# Patient Record
Sex: Female | Born: 1935 | Hispanic: No | Marital: Married | Smoking: Never smoker
Health system: Southern US, Community
[De-identification: ages and names within clinical notes are randomized; demographics above are authoritative.]

## PROBLEM LIST (undated history)

## (undated) DIAGNOSIS — I739 Peripheral vascular disease, unspecified: Secondary | ICD-10-CM

## (undated) DIAGNOSIS — R197 Diarrhea, unspecified: Secondary | ICD-10-CM

## (undated) DIAGNOSIS — I2510 Atherosclerotic heart disease of native coronary artery without angina pectoris: Secondary | ICD-10-CM

## (undated) DIAGNOSIS — I509 Heart failure, unspecified: Secondary | ICD-10-CM

## (undated) DIAGNOSIS — Z8673 Personal history of transient ischemic attack (TIA), and cerebral infarction without residual deficits: Secondary | ICD-10-CM

## (undated) DIAGNOSIS — I4891 Unspecified atrial fibrillation: Secondary | ICD-10-CM

## (undated) HISTORY — PX: CORONARY ARTERY BYPASS GRAFT: SHX141

## (undated) HISTORY — DX: Diarrhea, unspecified: R19.7

---

## 2015-10-04 HISTORY — PX: FEMORAL-POPLITEAL BYPASS GRAFT: SHX937

## 2015-10-08 ENCOUNTER — Inpatient Hospital Stay (HOSPITAL_COMMUNITY)
Admission: EM | Admit: 2015-10-08 | Discharge: 2015-10-16 | DRG: 871 | Disposition: A | Discharge status: Home Health | Payer: Medicare Other | Source: Other Acute Inpatient Hospital | Attending: Internal Medicine | Admitting: Internal Medicine

## 2015-10-08 DIAGNOSIS — I959 Hypotension, unspecified: Secondary | ICD-10-CM | POA: Diagnosis present

## 2015-10-08 DIAGNOSIS — D72829 Elevated white blood cell count, unspecified: Secondary | ICD-10-CM | POA: Diagnosis present

## 2015-10-08 DIAGNOSIS — D649 Anemia, unspecified: Secondary | ICD-10-CM | POA: Diagnosis not present

## 2015-10-08 DIAGNOSIS — N179 Acute kidney failure, unspecified: Secondary | ICD-10-CM | POA: Diagnosis present

## 2015-10-08 DIAGNOSIS — I4891 Unspecified atrial fibrillation: Secondary | ICD-10-CM | POA: Diagnosis not present

## 2015-10-08 DIAGNOSIS — I509 Heart failure, unspecified: Secondary | ICD-10-CM | POA: Diagnosis not present

## 2015-10-08 DIAGNOSIS — Z8673 Personal history of transient ischemic attack (TIA), and cerebral infarction without residual deficits: Secondary | ICD-10-CM

## 2015-10-08 DIAGNOSIS — I5023 Acute on chronic systolic (congestive) heart failure: Secondary | ICD-10-CM | POA: Diagnosis present

## 2015-10-08 DIAGNOSIS — I480 Paroxysmal atrial fibrillation: Secondary | ICD-10-CM | POA: Diagnosis present

## 2015-10-08 DIAGNOSIS — J189 Pneumonia, unspecified organism: Secondary | ICD-10-CM | POA: Diagnosis present

## 2015-10-08 DIAGNOSIS — R0603 Acute respiratory distress: Secondary | ICD-10-CM

## 2015-10-08 DIAGNOSIS — Z951 Presence of aortocoronary bypass graft: Secondary | ICD-10-CM | POA: Diagnosis not present

## 2015-10-08 DIAGNOSIS — I739 Peripheral vascular disease, unspecified: Secondary | ICD-10-CM | POA: Diagnosis present

## 2015-10-08 DIAGNOSIS — E873 Alkalosis: Secondary | ICD-10-CM | POA: Diagnosis not present

## 2015-10-08 DIAGNOSIS — E876 Hypokalemia: Secondary | ICD-10-CM | POA: Diagnosis present

## 2015-10-08 DIAGNOSIS — I420 Dilated cardiomyopathy: Secondary | ICD-10-CM | POA: Diagnosis not present

## 2015-10-08 DIAGNOSIS — J811 Chronic pulmonary edema: Secondary | ICD-10-CM

## 2015-10-08 DIAGNOSIS — A419 Sepsis, unspecified organism: Secondary | ICD-10-CM | POA: Diagnosis present

## 2015-10-08 DIAGNOSIS — R0600 Dyspnea, unspecified: Secondary | ICD-10-CM | POA: Diagnosis not present

## 2015-10-08 DIAGNOSIS — I2510 Atherosclerotic heart disease of native coronary artery without angina pectoris: Secondary | ICD-10-CM | POA: Diagnosis present

## 2015-10-08 DIAGNOSIS — R509 Fever, unspecified: Secondary | ICD-10-CM | POA: Diagnosis present

## 2015-10-08 DIAGNOSIS — I5022 Chronic systolic (congestive) heart failure: Secondary | ICD-10-CM | POA: Diagnosis not present

## 2015-10-08 DIAGNOSIS — I10 Essential (primary) hypertension: Secondary | ICD-10-CM | POA: Diagnosis not present

## 2015-10-08 DIAGNOSIS — Z95828 Presence of other vascular implants and grafts: Secondary | ICD-10-CM

## 2015-10-08 DIAGNOSIS — R0602 Shortness of breath: Secondary | ICD-10-CM

## 2015-10-08 DIAGNOSIS — J810 Acute pulmonary edema: Secondary | ICD-10-CM | POA: Diagnosis not present

## 2015-10-08 DIAGNOSIS — I429 Cardiomyopathy, unspecified: Secondary | ICD-10-CM | POA: Diagnosis present

## 2015-10-08 DIAGNOSIS — Y95 Nosocomial condition: Secondary | ICD-10-CM | POA: Diagnosis not present

## 2015-10-08 HISTORY — DX: Unspecified atrial fibrillation: I48.91

## 2015-10-08 HISTORY — DX: Heart failure, unspecified: I50.9

## 2015-10-08 HISTORY — DX: Personal history of transient ischemic attack (TIA), and cerebral infarction without residual deficits: Z86.73

## 2015-10-08 HISTORY — DX: Atherosclerotic heart disease of native coronary artery without angina pectoris: I25.10

## 2015-10-08 HISTORY — DX: Peripheral vascular disease, unspecified: I73.9

## 2015-10-08 MED ORDER — SODIUM CHLORIDE 0.9 % IJ SOLN: 3.0000 mL | INTRAMUSCULAR | Status: DC | PRN

## 2015-10-08 MED ORDER — FUROSEMIDE 10 MG/ML IJ SOLN
20.0000 mg | Freq: Four times a day (QID) | INTRAMUSCULAR | Status: DC
  Administered 2015-10-09 (×2): 20 mg via INTRAVENOUS
  Filled 2015-10-08 (×3): qty 2

## 2015-10-08 MED ORDER — SODIUM CHLORIDE 0.9 % IV SOLN: 250.0000 mL | INTRAVENOUS | Status: DC | PRN

## 2015-10-08 MED ORDER — ACETAMINOPHEN 325 MG PO TABS
650.0000 mg | ORAL_TABLET | ORAL | Status: DC | PRN
  Administered 2015-10-09: 650 mg via ORAL
  Filled 2015-10-08: qty 2

## 2015-10-08 MED ORDER — ONDANSETRON HCL 4 MG/2ML IJ SOLN: 4.0000 mg | Freq: Four times a day (QID) | INTRAMUSCULAR | Status: DC | PRN

## 2015-10-08 MED ORDER — IPRATROPIUM-ALBUTEROL 0.5-2.5 (3) MG/3ML IN SOLN
3.0000 mL | Freq: Four times a day (QID) | RESPIRATORY_TRACT | Status: DC
  Administered 2015-10-09 (×2): 3 mL via RESPIRATORY_TRACT
  Filled 2015-10-08 (×3): qty 3

## 2015-10-08 MED ORDER — ENOXAPARIN SODIUM 40 MG/0.4ML ~~LOC~~ SOLN
40.0000 mg | SUBCUTANEOUS | Status: DC
  Administered 2015-10-09 – 2015-10-16 (×8): 40 mg via SUBCUTANEOUS
  Filled 2015-10-08 (×8): qty 0.4

## 2015-10-08 MED ORDER — SODIUM CHLORIDE 0.9 % IJ SOLN
3.0000 mL | Freq: Two times a day (BID) | INTRAMUSCULAR | Status: DC
  Administered 2015-10-09 (×2): 3 mL via INTRAVENOUS

## 2015-10-09 ENCOUNTER — Encounter (HOSPITAL_COMMUNITY): Payer: Self-pay | Admitting: Internal Medicine

## 2015-10-09 ENCOUNTER — Inpatient Hospital Stay (HOSPITAL_COMMUNITY): Payer: Medicare Other

## 2015-10-09 DIAGNOSIS — I509 Heart failure, unspecified: Secondary | ICD-10-CM

## 2015-10-09 DIAGNOSIS — I25810 Atherosclerosis of coronary artery bypass graft(s) without angina pectoris: Secondary | ICD-10-CM

## 2015-10-09 DIAGNOSIS — D72829 Elevated white blood cell count, unspecified: Secondary | ICD-10-CM

## 2015-10-09 DIAGNOSIS — I739 Peripheral vascular disease, unspecified: Secondary | ICD-10-CM | POA: Diagnosis present

## 2015-10-09 DIAGNOSIS — I4891 Unspecified atrial fibrillation: Secondary | ICD-10-CM | POA: Diagnosis present

## 2015-10-09 DIAGNOSIS — R0600 Dyspnea, unspecified: Secondary | ICD-10-CM

## 2015-10-09 DIAGNOSIS — Z95828 Presence of other vascular implants and grafts: Secondary | ICD-10-CM

## 2015-10-09 LAB — CBC WITH DIFFERENTIAL/PLATELET
BASOS PCT: 1 %
Basophils Absolute: 0.1 10*3/uL (ref 0.0–0.1)
EOS ABS: 0.2 10*3/uL (ref 0.0–0.7)
EOS PCT: 2 %
HCT: 23.7 % — ABNORMAL LOW (ref 36.0–46.0)
HEMOGLOBIN: 7.8 g/dL — AB (ref 12.0–15.0)
LYMPHS ABS: 2.2 10*3/uL (ref 0.7–4.0)
Lymphocytes Relative: 25 %
MCH: 31.5 pg (ref 26.0–34.0)
MCHC: 32.9 g/dL (ref 30.0–36.0)
MCV: 95.6 fL (ref 78.0–100.0)
MONOS PCT: 13 %
Monocytes Absolute: 1.2 10*3/uL — ABNORMAL HIGH (ref 0.1–1.0)
NEUTROS PCT: 59 %
Neutro Abs: 5 10*3/uL (ref 1.7–7.7)
PLATELETS: 150 10*3/uL (ref 150–400)
RBC: 2.48 MIL/uL — ABNORMAL LOW (ref 3.87–5.11)
RDW: 14.4 % (ref 11.5–15.5)
WBC: 8.6 10*3/uL (ref 4.0–10.5)

## 2015-10-09 LAB — BASIC METABOLIC PANEL
Anion gap: 11 (ref 5–15)
BUN: 13 mg/dL (ref 6–20)
CALCIUM: 8.4 mg/dL — AB (ref 8.9–10.3)
CO2: 23 mmol/L (ref 22–32)
CREATININE: 1.26 mg/dL — AB (ref 0.44–1.00)
Chloride: 104 mmol/L (ref 101–111)
GFR, EST AFRICAN AMERICAN: 45 mL/min — AB (ref >60)
GFR, EST NON AFRICAN AMERICAN: 39 mL/min — AB (ref >60)
Glucose, Bld: 121 mg/dL — ABNORMAL HIGH (ref 65–99)
Potassium: 3.2 mmol/L — ABNORMAL LOW (ref 3.5–5.1)
SODIUM: 138 mmol/L (ref 135–145)

## 2015-10-09 LAB — CBC
HCT: 27.7 % — ABNORMAL LOW (ref 36.0–46.0)
HEMOGLOBIN: 9.3 g/dL — AB (ref 12.0–15.0)
MCH: 31.8 pg (ref 26.0–34.0)
MCHC: 33.6 g/dL (ref 30.0–36.0)
MCV: 94.9 fL (ref 78.0–100.0)
PLATELETS: 195 10*3/uL (ref 150–400)
RBC: 2.92 MIL/uL — ABNORMAL LOW (ref 3.87–5.11)
RDW: 14.4 % (ref 11.5–15.5)
WBC: 11.7 10*3/uL — ABNORMAL HIGH (ref 4.0–10.5)

## 2015-10-09 LAB — MRSA PCR SCREENING: MRSA BY PCR: NEGATIVE

## 2015-10-09 LAB — ABO/RH: ABO/RH(D): O POS

## 2015-10-09 LAB — MAGNESIUM: MAGNESIUM: 1.4 mg/dL — AB (ref 1.7–2.4)

## 2015-10-09 LAB — PREPARE RBC (CROSSMATCH)

## 2015-10-09 LAB — TSH: TSH: 1.744 u[IU]/mL (ref 0.350–4.500)

## 2015-10-09 LAB — D-DIMER, QUANTITATIVE (NOT AT ARMC): D DIMER QUANT: 1.58 ug{FEU}/mL — AB (ref 0.00–0.50)

## 2015-10-09 MED ORDER — BUDESONIDE 0.5 MG/2ML IN SUSP
0.5000 mg | Freq: Two times a day (BID) | RESPIRATORY_TRACT | Status: DC
  Administered 2015-10-09 – 2015-10-16 (×15): 0.5 mg via RESPIRATORY_TRACT
  Filled 2015-10-09 (×15): qty 2

## 2015-10-09 MED ORDER — METOPROLOL TARTRATE 1 MG/ML IV SOLN
10.0000 mg | Freq: Once | INTRAVENOUS | Status: AC
  Administered 2015-10-09: 10 mg via INTRAVENOUS

## 2015-10-09 MED ORDER — SODIUM CHLORIDE 0.9 % IV SOLN
INTRAVENOUS | Status: DC
  Administered 2015-10-09: 12:00:00 via INTRAVENOUS

## 2015-10-09 MED ORDER — CILOSTAZOL 100 MG PO TABS
100.0000 mg | ORAL_TABLET | Freq: Two times a day (BID) | ORAL | Status: DC
  Administered 2015-10-09 – 2015-10-11 (×5): 100 mg via ORAL
  Filled 2015-10-09 (×7): qty 1

## 2015-10-09 MED ORDER — CLOPIDOGREL BISULFATE 75 MG PO TABS
75.0000 mg | ORAL_TABLET | Freq: Every day | ORAL | Status: DC
  Administered 2015-10-09 – 2015-10-16 (×8): 75 mg via ORAL
  Filled 2015-10-09 (×9): qty 1

## 2015-10-09 MED ORDER — METOPROLOL TARTRATE 1 MG/ML IV SOLN
INTRAVENOUS | Status: AC
  Administered 2015-10-09: 10 mg via INTRAVENOUS
  Filled 2015-10-09: qty 10

## 2015-10-09 MED ORDER — FOLIC ACID 1 MG PO TABS
1.0000 mg | ORAL_TABLET | Freq: Two times a day (BID) | ORAL | Status: DC
  Administered 2015-10-09 – 2015-10-16 (×15): 1 mg via ORAL
  Filled 2015-10-09 (×15): qty 1

## 2015-10-09 MED ORDER — FUROSEMIDE 10 MG/ML IJ SOLN
60.0000 mg | Freq: Once | INTRAMUSCULAR | Status: AC
  Administered 2015-10-09: 60 mg via INTRAVENOUS

## 2015-10-09 MED ORDER — SODIUM CHLORIDE 0.9 % IV BOLUS (SEPSIS)
250.0000 mL | Freq: Once | INTRAVENOUS | Status: AC
Start: 2015-10-09 — End: 2015-10-09
  Administered 2015-10-09: 250 mL via INTRAVENOUS

## 2015-10-09 MED ORDER — MAGNESIUM SULFATE 2 GM/50ML IV SOLN
2.0000 g | Freq: Once | INTRAVENOUS | Status: AC
  Administered 2015-10-09: 2 g via INTRAVENOUS
  Filled 2015-10-09: qty 50

## 2015-10-09 MED ORDER — FUROSEMIDE 10 MG/ML IJ SOLN
INTRAMUSCULAR | Status: AC
  Administered 2015-10-09: 60 mg via INTRAVENOUS
  Filled 2015-10-09: qty 8

## 2015-10-09 MED ORDER — SODIUM CHLORIDE 0.9 % IV SOLN
Freq: Once | INTRAVENOUS | Status: AC
  Administered 2015-10-09: 13:00:00 via INTRAVENOUS

## 2015-10-09 MED ORDER — POTASSIUM CHLORIDE CRYS ER 20 MEQ PO TBCR
40.0000 meq | EXTENDED_RELEASE_TABLET | Freq: Once | ORAL | Status: AC
  Administered 2015-10-09: 40 meq via ORAL
  Filled 2015-10-09: qty 2

## 2015-10-09 MED ORDER — LEVALBUTEROL HCL 0.63 MG/3ML IN NEBU
0.6300 mg | INHALATION_SOLUTION | RESPIRATORY_TRACT | Status: DC | PRN
  Administered 2015-10-09 – 2015-10-12 (×4): 0.63 mg via RESPIRATORY_TRACT
  Filled 2015-10-09 (×4): qty 3

## 2015-10-09 MED ORDER — CETYLPYRIDINIUM CHLORIDE 0.05 % MT LIQD
7.0000 mL | Freq: Two times a day (BID) | OROMUCOSAL | Status: DC
  Administered 2015-10-09 – 2015-10-16 (×13): 7 mL via OROMUCOSAL

## 2015-10-09 MED ORDER — FUROSEMIDE 10 MG/ML IJ SOLN
20.0000 mg | Freq: Once | INTRAMUSCULAR | Status: AC
  Administered 2015-10-09: 20 mg via INTRAVENOUS
  Filled 2015-10-09: qty 2

## 2015-10-09 MED ORDER — DILTIAZEM HCL 100 MG IV SOLR
5.0000 mg/h | INTRAVENOUS | Status: DC
  Administered 2015-10-09: 15 mg/h via INTRAVENOUS
  Administered 2015-10-09: 10 mg/h via INTRAVENOUS
  Administered 2015-10-10 – 2015-10-11 (×5): 15 mg/h via INTRAVENOUS
  Filled 2015-10-09 (×7): qty 100

## 2015-10-09 MED ORDER — METOPROLOL TARTRATE 1 MG/ML IV SOLN
10.0000 mg | Freq: Four times a day (QID) | INTRAVENOUS | Status: DC
  Administered 2015-10-09 – 2015-10-11 (×7): 10 mg via INTRAVENOUS
  Filled 2015-10-09 (×7): qty 10

## 2015-10-09 NOTE — Progress Notes (Signed)
  Echocardiogram 2D Echocardiogram has been performed.  Delcie RochENNINGTON, Teletha Petrea 10/09/2015, 12:38 PM

## 2015-10-09 NOTE — H&P (Addendum)
[facility] History and Physical  Kelly Garner ZOX:[id number] DOB: 06-Nov-1932 DOA: 10/08/2015  Referring physician: Transferred from [place] PCP: No primary care provider on file.   Chief Complaint: Fever and shortness of breath    HPI:  Patient is a 81 year old female with a past medical history of peripheral vascular disease, CHF, CAD with previous history of three-vessel CABG with report of CATH in August of 2016, CVA 5 years ago; who initially presented to [facility] emergency department with complaints of fever, chills and shortness of breath. Patient was status post a Fem-pop bypass performed 5 days ago on 10/04/2015 at [facility] by Dr. Keith Rake. Patient apparently was discharged home on 10/07/2015. . Initial findings included a chest x-ray at [facility] emergency department showing the possibility of CHF/COPD, a BNP of 1187, temperature of 101.45F, and patient found to be in A. fib with RVR heart rates in the 150s. She was started on a diltiazem drip along with Vanco and Zosyn empirically at the emergency department. She was transferred to [facility] for need of higher level of care.  Review of Systems  Constitutional: Positive for fever and chills.  HENT: Negative for ear pain and tinnitus.   Eyes: Negative for photophobia and pain.  Respiratory: Positive for shortness of breath and wheezing.   Cardiovascular: Positive for palpitations and leg swelling. Negative for chest pain.  Gastrointestinal: Negative for vomiting and abdominal pain.  Genitourinary: Negative for urgency and frequency.  Musculoskeletal: Positive for joint pain. Negative for neck pain.  Neurological: Negative for seizures and loss of consciousness.  Endo/Heme/Allergies: Negative for polydipsia. Bruises/bleeds easily.  Psychiatric/Behavioral: Positive for memory loss. Negative for substance abuse.       Past Medical History  Diagnosis Date  . Coronary artery disease   . Peripheral vascular  disease (HCC)   . Congestive heart failure (CHF) (HCC)   . History of CVA (cerebrovascular accident)   . Atrial fibrillation [facility])      Past Surgical History  Procedure Laterality Date  . Femoral-popliteal bypass graft  10/04/15      Social History:  has no tobacco, alcohol, and drug history on file. Where does patient live--home and with whom if at home? Husband Can patient participate in ADLs? Patient needs assistance  Allergies  Allergen Reactions  . Aspirin Swelling and Rash  . Macrobid [employer] Macro] Other (See Comments)    Affects lungs  . Codeine Rash    No family history on file.      Prior to Admission medications   Not on File     Physical Exam: Filed Vitals:   10/08/15 2325 10/09/15 0142 10/09/15 0210  BP: 96/57    Pulse: 98  98  Temp: 99 F (37.2 C)    TempSrc: Oral    Resp: 26    Height:  (1.6 m)    Weight: 92.8 kg (204 lb 9.4 oz)    SpO2: 92% 92% 95%     Constitutional: Vital signs reviewed. Patient is a well-developed and well-nourished in no acute distress and cooperative with exam. Alert and oriented x3.  Head: Normocephalic and atraumatic  Ear: TM normal bilaterally  Mouth: no erythema or exudates, MMM  Eyes: PERRL, EOMI, conjunctivae normal, No scleral icterus.  Neck: Supple, Trachea midline normal ROM, No JVD, mass, thyromegaly, or carotid bruit present.  Cardiovascular: Irregularly irregular rhythm.  Pulmonary/Chest: Positive crackles in the lower lung bases Abdominal: Soft. Non-tender, non-distended, bowel sounds are normal, no masses, organomegaly, or guarding  present.  GU: no CVA tenderness Musculoskeletal: No joint deformities, erythema, or stiffness, ROM full and no nontender Ext: +2 pitting edema of the left lower extremity more so noticeable than the right lower extremity. and no cyanosis, pulses palpable bilaterally (DP and PT)  Hematology: no cervical, inginal, or axillary adenopathy.  Neurological: A&O  x3, strength is decreased on the left side and approximately a 4 out of 5. Family notes that this is chronic and not new.  Skin: Incisional wounds of the left thigh and chronic with staples appeared to be intact with no significant warmth or drainage present  Psychiatric: Normal mood and affect. speech and behavior is normal. Judgment and thought content normal. decreased memory Patient has a right femoral central line in place   Data Review   Micro Results No results found for this or any previous visit (from the past 240 hour(s)).  Radiology Reports No results found.   CBC No results for input(s): WBC, HGB, HCT, PLT, MCV, MCH, MCHC, RDW, LYMPHSABS, MONOABS, EOSABS, BASOSABS, BANDABS in the last 168 hours.  Invalid input(s): NEUTRABS, BANDSABD  Chemistries  No results for input(s): NA, K, CL, CO2, GLUCOSE, BUN, CREATININE, CALCIUM, MG, AST, ALT, ALKPHOS, BILITOT in the last 168 hours.  Invalid input(s): GFRCGP ------------------------------------------------------------------------------------------------------------------ CrCl cannot be calculated (Patient has no serum creatinine result on file.). ------------------------------------------------------------------------------------------------------------------ No results for input(s): HGBA1C in the last 72 hours. ------------------------------------------------------------------------------------------------------------------ No results for input(s): CHOL, HDL, LDLCALC, TRIG, CHOLHDL, LDLDIRECT in the last 72 hours. ------------------------------------------------------------------------------------------------------------------ No results for input(s): TSH, T4TOTAL, T3FREE, THYROIDAB in the last 72 hours.  Invalid input(s): FREET3 ------------------------------------------------------------------------------------------------------------------ No results for input(s): VITAMINB12, FOLATE, FERRITIN, TIBC, IRON, RETICCTPCT in the  last 72 hours.  Coagulation profile No results for input(s): INR, PROTIME in the last 168 hours.  No results for input(s): DDIMER in the last 72 hours.  Cardiac Enzymes No results for input(s): CKMB, TROPONINI, MYOGLOBIN in the last 168 hours.  Invalid input(s): CK ------------------------------------------------------------------------------------------------------------------ Invalid input(s): POCBNP   CBG: No results for input(s): GLUCAP in the last 168 hours.     EKG: Independently reviewed. Shows atrial fibrillation with RVR   Assessment/Plan Principal Problem:  Acute exacerbation of Heart failure [facility](HCC): Patient reported shortness of breath following recent surgery. Last echocardiogram is not known. Patient with a initial BNP of 1187. Chest x-ray showing signs of pulmonary vascular congestion. -Strict ins and outs -Gentle Lasix IV 20 mg q 6hr as tolerated by blood pressure -[facility] [place]    SIRS criteria considering fever 101.11F, atrial fibrillation, and WBC count of 12.5 initially seen at outside facility no clear source known at this time. Would suspect recent surgery site. -Continue empiric antibiotics  Atrial fibrillation with RVR (HCC) -Check TSH -Started patient on a diltiazem drip -Will need to restart home meds when able  Status post femoral-popliteal bypass surgery: Continue to monitor site for any signs of infection appears to be healing well.  Leukocytosis: White blood cell count initially noted to be 12.5 patient with acute febrile 101.11F. -Follow up [facility] [place]    Peripheral vascular disease (HCC) -Restart home meds  Coronary artery disease status post three-vessel CABG. There is note of a recent cath in August 2016. Records are not available at this time.  *Records from outside facility at nursing station.  Code Status:   full Family Communication:  bedside Disposition Plan: admit   Total time spent 55 minutes.Greater than 50% of this time  was spent in counseling, explanation of diagnosis, planning of further management, and coordination of care  Clydie Braun [facility] Pager [phone]  If 7PM-7AM, please contact night-coverage [email or web address] Password [facility] 10/09/2015, 2:37 AM

## 2015-10-09 NOTE — Progress Notes (Signed)
[facility] around midnight, admitted to Rm/3s16 with family at bedside. Pt comes from home with spouse. She is alert and oriented x4. Surgical incision with staples noted to Left groin (Level 0) and Left leg from surgery performed last week (12/2). Incisions clean, dry and intact; open to air. Foley catheter in place with urine return. Placed on telemetry, currently in Afib on Cardizen drip. Oriented to room, instructed to call for assistance before getting out of bed. Dr Katrinka BlazingSmith to bedside to evaluate. Resting comfortably at this time, will continue to monitor

## 2015-10-09 NOTE — Progress Notes (Signed)
[place] TEAM 1 - Stepdown/ICU TEAM PROGRESS NOTE  Wendie SimmerBetty Arceneaux OZH:[id number] DOB: 1933-08-10 DOA: 10/08/2015 PCP: No primary care provider on file.  Admit HPI / Brief Narrative: 81 year old female with a history of PVD, CHF, CAD with three-vessel CABG with report of CATH in August of 2016, CVA 5 years ago who [facility] with complaints of fever, chills and shortness of breath. Patient was status post a Fem[facility] [place] [facility] Dr. Keith RakeJames Drougas. Patient apparently was discharged home on 10/07/2015.  Initial findings included a chest x-ray showing the possibility of CHF/COPD, a BNP of 1187, temperature of 101.65F, and the patient was found to be in A. fib with heart rates in the 150s. She was started on a diltiazem drip along with Vanco and Zosyn empirically. [facility]  HPI/Subjective: Pt seen for f/u visit.  Assessment/Plan:  SIRS temp 101.65F, HR 130, WBC 12.5 initially at outside facility - no clear infection source known at this time - no indication for abx at this time   Hypotension   Hx of Parox Atrial fibrillation with acute RVR  TSH normal - previously on coumadin (stopped due to ICH)  Status post femoral-popliteal bypass surgery monitor site for any signs of infection   Reported hx of CHF Quantification not available   Leukocytosis Resolved   Hypokalemia   Acute renal failure  crt was normal at 0.87 12/4 per Care Everywhere   Normocytic anemia  Goal Hgb should be 8.0 - Hgb was 8.8 on 12/4 per Care Everywhere, but preop was as high as 10.5    Peripheral vascular disease  -Restart home meds  Coronary artery disease status post three-vessel CABG there is note of a recent cath in August 2016 - records are not available at this time  Hx of hemorrhagic CVA in 2009  Code Status: FULL Family Communication: no family present at time of exam Disposition Plan: clinically the pt appears dry to  me - I will transfuse and gently hydrate - cont cardizem gtt for now - check TTE   Consultants: none  Procedures: none  Antibiotics: none  DVT prophylaxis: lovenox   Objective: Blood pressure 96/60, pulse 85, temperature 97.2 F (36.2 C), temperature source Oral, resp. rate 19, height 5\' 3"  (1.6 m), weight 92.8 kg (204 lb 9.4 oz), SpO2 96 %.  Intake/Output Summary (Last 24 hours) at 10/09/15 0937 Last data filed at 10/09/15 0727  Gross per 24 hour  Intake      0 ml  Output    350 ml  Net   -350 ml   Exam: Pt seen for f/u visit.  Data Reviewed: Basic Metabolic Panel:  Recent Labs Lab 10/09/15 0545  NA 138  K 3.2*  CL 104  CO2 23  GLUCOSE 121*  BUN 13  CREATININE 1.26*  CALCIUM 8.4*    CBC:  Recent Labs Lab 10/09/15 0545  WBC 8.6  NEUTROABS 5.0  HGB 7.8*  HCT 23.7*  MCV 95.6  PLT 150    Liver Function Tests: No results for input(s): AST, ALT, ALKPHOS, BILITOT, PROT, ALBUMIN in the last 168 hours. No results for input(s): LIPASE, AMYLASE in the last 168 hours. No results for input(s): AMMONIA in the last 168 hours.  Coags: No results for input(s): INR in the last 168 hours.  Invalid input(s): PT No results for input(s): APTT in the last 168 hours.  Cardiac Enzymes: No results for input(s): CKTOTAL, CKMB, CKMBINDEX,  TROPONINI in the last 168 hours.  CBG: No results for input(s): GLUCAP in the last 168 hours.  Recent Results (from the past 240 hour(s))  MRSA PCR Screening     Status: None   Collection Time: 10/09/15  4:56 AM  Result Value Ref Range Status   MRSA by PCR NEGATIVE NEGATIVE Final    Comment:        The GeneXpert MRSA Assay (FDA approved for NASAL specimens only), is one component of a comprehensive MRSA colonization surveillance program. It is not intended to diagnose MRSA infection nor to guide or monitor treatment for MRSA infections.      Studies:   Recent x-ray studies have been reviewed in detail by the  Attending Physician  Scheduled Meds:  Scheduled Meds: . antiseptic oral rinse  7 mL Mouth Rinse BID  . enoxaparin (LOVENOX) injection  40 mg Subcutaneous Q24H  . furosemide  20 mg Intravenous Q6H  . ipratropium-albuterol  3 mL Nebulization Q6H  . sodium chloride  3 mL Intravenous Q12H    Time spent on care of this patient: No charge   Lonia Blood , MD   [facility] Office  [phone] Pager - Text Page per Loretha Stapler as per below:  On-Call/Text Page:      [email or web address]      password [email or web address]  If 7PM-7AM, please contact night-coverage [email or web address] Password [email or web address] 10/09/2015, 9:37 AM   LOS: 1 day

## 2015-10-09 NOTE — Progress Notes (Signed)
UR COMPLETED  

## 2015-10-10 ENCOUNTER — Inpatient Hospital Stay (HOSPITAL_COMMUNITY): Payer: Medicare Other

## 2015-10-10 ENCOUNTER — Encounter (HOSPITAL_COMMUNITY): Payer: Self-pay | Admitting: *Deleted

## 2015-10-10 DIAGNOSIS — I429 Cardiomyopathy, unspecified: Secondary | ICD-10-CM

## 2015-10-10 DIAGNOSIS — I480 Paroxysmal atrial fibrillation: Secondary | ICD-10-CM | POA: Diagnosis present

## 2015-10-10 DIAGNOSIS — I2510 Atherosclerotic heart disease of native coronary artery without angina pectoris: Secondary | ICD-10-CM | POA: Diagnosis present

## 2015-10-10 DIAGNOSIS — E876 Hypokalemia: Secondary | ICD-10-CM | POA: Diagnosis present

## 2015-10-10 DIAGNOSIS — E873 Alkalosis: Secondary | ICD-10-CM | POA: Diagnosis present

## 2015-10-10 DIAGNOSIS — N179 Acute kidney failure, unspecified: Secondary | ICD-10-CM | POA: Diagnosis present

## 2015-10-10 DIAGNOSIS — J811 Chronic pulmonary edema: Secondary | ICD-10-CM | POA: Diagnosis present

## 2015-10-10 LAB — TYPE AND SCREEN
ABO/RH(D): O POS
ANTIBODY SCREEN: NEGATIVE
UNIT DIVISION: 0
UNIT DIVISION: 0

## 2015-10-10 LAB — COMPREHENSIVE METABOLIC PANEL
ALK PHOS: 63 U/L (ref 38–126)
ALT: 19 U/L (ref 14–54)
ANION GAP: 13 (ref 5–15)
AST: 27 U/L (ref 15–41)
Albumin: 2.7 g/dL — ABNORMAL LOW (ref 3.5–5.0)
BILIRUBIN TOTAL: 1.7 mg/dL — AB (ref 0.3–1.2)
BUN: 15 mg/dL (ref 6–20)
CALCIUM: 9.1 mg/dL (ref 8.9–10.3)
CO2: 23 mmol/L (ref 22–32)
CREATININE: 0.96 mg/dL (ref 0.44–1.00)
Chloride: 103 mmol/L (ref 101–111)
GFR calc Af Amer: >60 mL/min (ref >60)
GFR calc non Af Amer: 54 mL/min — ABNORMAL LOW (ref >60)
GLUCOSE: 166 mg/dL — AB (ref 65–99)
Potassium: 3.8 mmol/L (ref 3.5–5.1)
SODIUM: 139 mmol/L (ref 135–145)
Total Protein: 7.4 g/dL (ref 6.5–8.1)

## 2015-10-10 LAB — CBC
HCT: 32.2 % — ABNORMAL LOW (ref 36.0–46.0)
Hemoglobin: 10.8 g/dL — ABNORMAL LOW (ref 12.0–15.0)
MCH: 31.7 pg (ref 26.0–34.0)
MCHC: 33.5 g/dL (ref 30.0–36.0)
MCV: 94.4 fL (ref 78.0–100.0)
PLATELETS: 194 10*3/uL (ref 150–400)
RBC: 3.41 MIL/uL — AB (ref 3.87–5.11)
RDW: 14.5 % (ref 11.5–15.5)
WBC: 14.4 10*3/uL — AB (ref 4.0–10.5)

## 2015-10-10 LAB — BLOOD GAS, ARTERIAL
Acid-Base Excess: 1.3 mmol/L (ref 0.0–2.0)
BICARBONATE: 24.7 meq/L — AB (ref 20.0–24.0)
O2 Content: 3 L/min
O2 Saturation: 89.1 %
PCO2 ART: 34.4 mmHg — AB (ref 35.0–45.0)
PH ART: 7.469 — AB (ref 7.350–7.450)
Patient temperature: 98.6
TCO2: 25.7 mmol/L (ref 0–100)
pO2, Arterial: 56.2 mmHg — ABNORMAL LOW (ref 80.0–100.0)

## 2015-10-10 LAB — PROTIME-INR
INR: 1.4 (ref 0.00–1.49)
Prothrombin Time: 17.3 s — ABNORMAL HIGH (ref 11.6–15.2)

## 2015-10-10 LAB — APTT: aPTT: 33 s (ref 24–37)

## 2015-10-10 LAB — INFLUENZA PANEL BY PCR (TYPE A & B)
H1N1FLUPCR: NOT DETECTED
INFLBPCR: NEGATIVE
Influenza A By PCR: NEGATIVE

## 2015-10-10 LAB — PROCALCITONIN: PROCALCITONIN: 0.12 ng/mL

## 2015-10-10 LAB — LACTIC ACID, PLASMA: LACTIC ACID, VENOUS: 1.3 mmol/L (ref 0.5–2.0)

## 2015-10-10 LAB — MAGNESIUM: MAGNESIUM: 1.7 mg/dL (ref 1.7–2.4)

## 2015-10-10 MED ORDER — METOPROLOL TARTRATE 1 MG/ML IV SOLN
INTRAVENOUS | Status: AC
  Filled 2015-10-10: qty 5

## 2015-10-10 MED ORDER — LORAZEPAM 2 MG/ML IJ SOLN
1.0000 mg | Freq: Once | INTRAMUSCULAR | Status: AC
  Administered 2015-10-10: 1 mg via INTRAVENOUS
  Filled 2015-10-10: qty 1

## 2015-10-10 MED ORDER — VANCOMYCIN HCL IN DEXTROSE 1-5 GM/200ML-% IV SOLN
1000.0000 mg | Freq: Once | INTRAVENOUS | Status: DC
  Filled 2015-10-10: qty 200

## 2015-10-10 MED ORDER — PIPERACILLIN-TAZOBACTAM 3.375 G IVPB
3.3750 g | Freq: Three times a day (TID) | INTRAVENOUS | Status: AC
  Administered 2015-10-10 – 2015-10-14 (×13): 3.375 g via INTRAVENOUS
  Filled 2015-10-10 (×16): qty 50

## 2015-10-10 MED ORDER — METHYLPREDNISOLONE SODIUM SUCC 125 MG IJ SOLR
60.0000 mg | INTRAMUSCULAR | Status: DC
  Administered 2015-10-10 – 2015-10-11 (×2): 60 mg via INTRAVENOUS
  Filled 2015-10-10 (×2): qty 2

## 2015-10-10 MED ORDER — FUROSEMIDE 10 MG/ML IJ SOLN
80.0000 mg | Freq: Once | INTRAMUSCULAR | Status: AC
  Administered 2015-10-10: 80 mg via INTRAVENOUS
  Filled 2015-10-10: qty 8

## 2015-10-10 MED ORDER — PIPERACILLIN-TAZOBACTAM 3.375 G IVPB 30 MIN
3.3750 g | Freq: Once | INTRAVENOUS | Status: AC
  Administered 2015-10-10: 3.375 g via INTRAVENOUS
  Filled 2015-10-10: qty 50

## 2015-10-10 MED ORDER — VANCOMYCIN HCL 10 G IV SOLR
2000.0000 mg | Freq: Once | INTRAVENOUS | Status: AC
  Administered 2015-10-10: 2000 mg via INTRAVENOUS
  Filled 2015-10-10: qty 2000

## 2015-10-10 MED ORDER — ZOLPIDEM TARTRATE 5 MG PO TABS
5.0000 mg | ORAL_TABLET | Freq: Once | ORAL | Status: AC
  Administered 2015-10-10: 5 mg via ORAL
  Filled 2015-10-10: qty 1

## 2015-10-10 MED ORDER — HALOPERIDOL LACTATE 5 MG/ML IJ SOLN: 3.0000 mg | Freq: Four times a day (QID) | INTRAMUSCULAR | Status: DC | PRN

## 2015-10-10 MED ORDER — METOPROLOL TARTRATE 1 MG/ML IV SOLN
5.0000 mg | Freq: Once | INTRAVENOUS | Status: AC
  Administered 2015-10-10: 5 mg via INTRAVENOUS

## 2015-10-10 MED ORDER — VANCOMYCIN HCL 10 G IV SOLR
1250.0000 mg | INTRAVENOUS | Status: DC
  Administered 2015-10-11: 1250 mg via INTRAVENOUS
  Filled 2015-10-10: qty 1250

## 2015-10-10 NOTE — Progress Notes (Addendum)
ANTIBIOTIC CONSULT NOTE - INITIAL  Pharmacy Consult for vanc/zosyn Indication: sepsis  Allergies  Allergen Reactions  . Aspirin Swelling and Rash  . Macrobid [employer][Nitrofurantoin Monohyd Macro] Other (See Comments)    Affects lungs  . Codeine Rash  . Other Rash    Metal underneath watch caused redness to skin    Patient Measurements: Height: 5\' 3"  (160 cm) Weight: 204 lb 2.3 oz (92.6 kg) IBW/kg (Calculated) : 52.4   Vital Signs: Temp: 98.6 F (37 C) (12/08 0748) Temp Source: Axillary (12/08 0748) BP: 147/88 mmHg (12/08 0400) Pulse Rate: 84 (12/08 0400) Intake/Output from previous day: 12/07 0701 - 12/08 0700 In: 2786.2 [P.O.:1020; I.V.:1140.2; Blood:576; IV Piggyback:50] Out: 2550 [Urine:2550] Intake/Output from this shift:    Labs:  Recent Labs  10/09/15 0545 10/09/15 1645 10/10/15 0600  WBC 8.6 11.7* 14.4*  HGB 7.8* 9.3* 10.8*  PLT 150 195 194  CREATININE 1.26*  --  0.96   Estimated Creatinine Clearance: 50.5 mL/min (by C-G formula based on Cr of 0.96). No results for input(s): VANCOTROUGH, VANCOPEAK, VANCORANDOM, GENTTROUGH, GENTPEAK, GENTRANDOM, TOBRATROUGH, TOBRAPEAK, TOBRARND, AMIKACINPEAK, AMIKACINTROU, AMIKACIN in the last 72 hours.   Microbiology: Recent Results (from the past 720 hour(s))  MRSA PCR Screening     Status: None   Collection Time: 10/09/15  4:56 AM  Result Value Ref Range Status   MRSA by PCR NEGATIVE NEGATIVE Final    Comment:        The GeneXpert MRSA Assay (FDA approved for NASAL specimens only), is one component of a comprehensive MRSA colonization surveillance program. It is not intended to diagnose MRSA infection nor to guide or monitor treatment for MRSA infections.     Medical History: Past Medical History  Diagnosis Date  . Coronary artery disease   . Peripheral vascular disease (HCC)   . Congestive heart failure (CHF) (HCC)   . History of CVA (cerebrovascular accident)   . Atrial fibrillation (HCC)       Assessment: 82 yof presenting with fever, chills, SOB. [facility] Pharmacy consulted to dose vanc/zosyn for sepsis, D#1. Afeb, wbc up to 14.4. SCr down to 0.96, CrCl~50. Communicated order of abx and 1 hour time window to give abx with RN.  12/8 Vanc>> 12/8 Zosyn>>  12/8 BCx2>> 12/8 UC>> 12/8 Resp virus panel>> MRSA pcr neg  Goal of Therapy:  Vancomycin trough level 15-20 mcg/ml  Plan:  Vanc 2g IV x 1 dose; then Vanc 1250mg  IV q24h Zosyn 3.375g IV (30min inf); then Zoysn 3.375g IV q8h Monitor clinical progress, c/s, renal function, abx plan/LOT VT@SS  as indicated  Babs BertinHaley Cartez Mogle, PharmD, [facility] Clinical Pharmacist Pager [phone] 10/10/2015 9:51 AM

## 2015-10-10 NOTE — Progress Notes (Signed)
New orders rec'd to give lasix IV for pulmonary edema confirmed by cxr and labwork ordered. Will continue to monitor.

## 2015-10-10 NOTE — Progress Notes (Signed)
New orders for HR 120's lopressor IV given

## 2015-10-10 NOTE — Progress Notes (Addendum)
Pt pulled foley out, new orders rec'd to reinsert follow to monitor strict in's and out due to pt being unstable. Mittens applied.

## 2015-10-10 NOTE — Progress Notes (Signed)
[place] TEAM 1 - Stepdown/ICU TEAM Progress Note  Kelly Garner AVW:[id number] DOB: 11/26/1932 DOA: 10/08/2015 PCP: No primary care provider on file.  Admit HPI / Brief Narrative: 81 yo WF PMHx PVD, CHF, CAD with three-vessel CABG with report of CATH in August of 2016, CVA 5 years ago   Presented to [facility] ED with complaints of fever, chills and shortness of breath. Patient was status post a Fem-pop bypass on 10/04/2015 at [facility] by Dr. Keith Rake. Patient apparently was discharged home on 10/07/2015. Initial findings included a chest x-ray showing the possibility of CHF/COPD, a BNP of 1187, temperature of 101.21F, and the patient was found to be in A. fib with heart rates in the 150s. She was started on a diltiazem drip along with Vanco and Zosyn empirically. She was transferred to [facility].   HPI/Subjective: 12/8 A/O 2 (does not know when, why), follows most commands, does not know her dry weight   Assessment/Plan: Sepsis unspecified organism -Patient has criteria for SIRS + AMS which meets criteria for sepsis  -Afebrile overnight.  -Leukocytosis trending up  -Patient tachypnea RR 31, WBC= 14.4. Initiate sepsis workup.  Paroxysmal Atrial fibrillation with acute RVR  -TSH normal  - previously on coumadin (stopped due to ICH) -Continue Cardizem drip -Continue metoprolol 10 mg QID -  Dilated cardiomyopathy -Quantification not available -Strict in and out -13.74ml -Daily a.m. Weight; admission weight bed = 92.8 kg      12/8 weight= 92.6 kg -12/8 PCXR worsening pulmonary edema/small pleural effusion Lasix 80 mg 1  Peripheral vascular disease  -DC Cilostazol  as it is contraindicated in CHF patients  Coronary artery disease status post three-vessel CABG -there is note of a recent cath in August 2016 - records are not available at this time  Anion gap Respiratory alkalosis -Most likely secondary to sepsis however in this patient DDx  would also be stroke,  PE -See sepsis -If continue altered mental status after removing sedating medication would obtain head CT -If continued SOB/tachypnea perform CT angiogram  Wheezing/SOB -SPO2 staying and low 90s however patient using accessory muscles to breath with altered mental status -Solu-Medrol 60 mg daily -Xopenex QID (A. fib with RVR) -Flutter valve  Status post femoral-popliteal bypass surgery -monitor site for any signs of infection   Hypokalemia  -Potassium goal> 4  Hypomagnesemia -Magnesium goal> 2  Acute renal failure  Cr was normal at 0.87 12/4 per Care Everywhere   Normocytic anemia  -Goal Hgb should be 8.0 - Hgb was 8.8 on 12/4 per Care Everywhere, but preop was as high as 10.5    Hx of hemorrhagic CVA in 2009  Altered mental status -Could be multifactorial to include overmedication i.e. Ambien + Ativan, sepsis, acute respiratory failure  -Please do not use Ambien or Ativan in this 81 year old septic patient.   Agitation -Haldol 3 mg QID PRN     Code Status: FULL Family Communication: Spoke with daughter on phone received permission for CVL/PICC line placement Disposition Plan: Resolution sepsis    Consultants: NA  Procedure/Significant Events: 12/7 echocardiogram;- Left ventricle: mildly dilated. -LVEF= 50% to 55%.  - Left atrium: moderately dilated. - Inferior vena cava: The vessel was dilated. The respirophasic  diameter changes were blunted (< 50%), consistent with elevated CVP.   Culture 12/8 blood culture pending 12/8 urine culture pending 12/8 respiratory panel 12/8 influenza panel   Antibiotics: Zosyn 12/8>> Vancomycin 12/8>>   DVT prophylaxis: NA   Devices NA   LINES / TUBES:  NA    Continuous Infusions: . sodium chloride 10 mL/hr at 10/09/15 1649  . diltiazem (CARDIZEM) infusion 15 mg/hr (10/10/15 1504)    Objective: VITAL SIGNS: Temp: 99 F (37.2 C) (12/08 1500) Temp Source: Oral (12/08 1500) BP: 105/61 mmHg (12/08  1800) Pulse Rate: 99 (12/08 1800) SPO2; FIO2:   Intake/Output Summary (Last 24 hours) at 10/10/15 1857 Last data filed at 10/10/15 1504  Gross per 24 hour  Intake 1820.09 ml  Output   2585 ml  Net -764.91 ml     Exam: General:A/O 2 (does not know when, why), follows most commands, positive Acute respiratory distress (using accessory muscles)  Eyes: Negative headache, eye pain, negative scleral hemorrhage ENT: Negative Runny nose, negative gingival bleeding, Neck:  Negative scars, masses, torticollis, lymphadenopathy, JVD Lungs: air movement in all lung fields with diffuse expiratory wheezing, use of accessory muscles, negative crackles  Cardiovascular: Irregular irregular rhythm and rate, without murmur gallop or rub normal S1 and S2 Abdomen:negative abdominal pain, nondistended, positive soft, bowel sounds, no rebound, no ascites, no appreciable mass Extremities: No significant cyanosis, clubbing, or edema bilateral lower extremities Psychiatric:  Negative depression, negative anxiety, negative fatigue, negative mania  Neurologic:  Cranial nerves II through XII intact, tongue/uvula midline, all extremities muscle strength 5/5, sensation intact throughout, negative dysarthria, negative expressive aphasia, negative receptive aphasia.   Data Reviewed: Basic Metabolic Panel:  Recent Labs Lab 10/09/15 0545 10/09/15 1645 10/10/15 0600 10/10/15 0930  NA 138  --  139  --   K 3.2*  --  3.8  --   CL 104  --  103  --   CO2 23  --  23  --   GLUCOSE 121*  --  166*  --   BUN 13  --  15  --   CREATININE 1.26*  --  0.96  --   CALCIUM 8.4*  --  9.1  --   MG  --  1.4*  --  1.7   Liver Function Tests:  Recent Labs Lab 10/10/15 0600  AST 27  ALT 19  ALKPHOS 63  BILITOT 1.7*  PROT 7.4  ALBUMIN 2.7*   No results for input(s): LIPASE, AMYLASE in the last 168 hours. No results for input(s): AMMONIA in the last 168 hours. CBC:  Recent Labs Lab 10/09/15 0545 10/09/15 1645  10/10/15 0600  WBC 8.6 11.7* 14.4*  NEUTROABS 5.0  --   --   HGB 7.8* 9.3* 10.8*  HCT 23.7* 27.7* 32.2*  MCV 95.6 94.9 94.4  PLT 150 195 194   Cardiac Enzymes: No results for input(s): CKTOTAL, CKMB, CKMBINDEX, TROPONINI in the last 168 hours. BNP (last 3 results) No results for input(s): BNP in the last 8760 hours.  ProBNP (last 3 results) No results for input(s): PROBNP in the last 8760 hours.  CBG: No results for input(s): GLUCAP in the last 168 hours.  Recent Results (from the past 240 hour(s))  MRSA PCR Screening     Status: None   Collection Time: 10/09/15  4:56 AM  Result Value Ref Range Status   MRSA by PCR NEGATIVE NEGATIVE Final    Comment:        The GeneXpert MRSA Assay (FDA approved for NASAL specimens only), is one component of a comprehensive MRSA colonization surveillance program. It is not intended to diagnose MRSA infection nor to guide or monitor treatment for MRSA infections.      Studies:  Recent x-ray studies have been reviewed in detail by the  Attending Physician  Scheduled Meds:  Scheduled Meds: . antiseptic oral rinse  7 mL Mouth Rinse BID  . budesonide  0.5 mg Nebulization BID  . cilostazol  100 mg Oral BID  . clopidogrel  75 mg Oral Daily  . enoxaparin (LOVENOX) injection  40 mg Subcutaneous Q24H  . folic acid  1 mg Oral BID  . methylPREDNISolone (SOLU-MEDROL) injection  60 mg Intravenous Q24H  . metoprolol  10 mg Intravenous 4 times per day  . piperacillin-tazobactam (ZOSYN)  IV  3.375 g Intravenous 3 times per day  . [START ON 10/11/2015] vancomycin  1,250 mg Intravenous Q24H    Time spent on care of this patient: 40 mins   WOODS, Roselind Messier , MD  [facility] Office  [phone] Pager - [phone]  On-Call/Text Page:      [email or web address]      password [email or web address]  If 7PM-7AM, please contact night-coverage [email or web address] Password [email or web address] 10/10/2015, 6:57 PM   LOS: 2 days   Care during the described time interval was  provided by me .  I have reviewed this patient's available data, including medical history, events of note, physical examination, and all test results as part of my evaluation. I have personally reviewed and interpreted all radiology studies.   Carolyne Littles, MD [phone] Pager

## 2015-10-10 NOTE — Progress Notes (Signed)
Paged dr Joseph Artwoods regarding pt wheezy, HR 120's, resp 30's, using accessory muscles, very confused, trashing in the bed, attempting to get out of bed. Increased oxygen to 4l/min. New orders rec'd for stat portable chest xray and ABG, notify respiratory.

## 2015-10-10 NOTE — Procedures (Signed)
Pt placed on home BiPAP with 3L of oxygen added.  Pt is resting comfortably and tolerating well.

## 2015-10-11 ENCOUNTER — Inpatient Hospital Stay (HOSPITAL_COMMUNITY): Payer: Medicare Other

## 2015-10-11 DIAGNOSIS — J810 Acute pulmonary edema: Secondary | ICD-10-CM

## 2015-10-11 DIAGNOSIS — E876 Hypokalemia: Secondary | ICD-10-CM

## 2015-10-11 DIAGNOSIS — N179 Acute kidney failure, unspecified: Secondary | ICD-10-CM

## 2015-10-11 LAB — BASIC METABOLIC PANEL
ANION GAP: 10 (ref 5–15)
BUN: 30 mg/dL — ABNORMAL HIGH (ref 6–20)
CALCIUM: 9 mg/dL (ref 8.9–10.3)
CO2: 24 mmol/L (ref 22–32)
Chloride: 104 mmol/L (ref 101–111)
Creatinine, Ser: 1.28 mg/dL — ABNORMAL HIGH (ref 0.44–1.00)
GFR, EST AFRICAN AMERICAN: 45 mL/min — AB (ref >60)
GFR, EST NON AFRICAN AMERICAN: 38 mL/min — AB (ref >60)
GLUCOSE: 175 mg/dL — AB (ref 65–99)
Potassium: 3.6 mmol/L (ref 3.5–5.1)
Sodium: 138 mmol/L (ref 135–145)

## 2015-10-11 LAB — URINE CULTURE: Culture: NO GROWTH

## 2015-10-11 LAB — CBC WITH DIFFERENTIAL/PLATELET
BASOS ABS: 0 10*3/uL (ref 0.0–0.1)
BASOS PCT: 0 %
EOS PCT: 0 %
Eosinophils Absolute: 0 10*3/uL (ref 0.0–0.7)
HEMATOCRIT: 26.4 % — AB (ref 36.0–46.0)
Hemoglobin: 9.1 g/dL — ABNORMAL LOW (ref 12.0–15.0)
Lymphocytes Relative: 9 %
Lymphs Abs: 1 10*3/uL (ref 0.7–4.0)
MCH: 32.6 pg (ref 26.0–34.0)
MCHC: 34.5 g/dL (ref 30.0–36.0)
MCV: 94.6 fL (ref 78.0–100.0)
MONO ABS: 1 10*3/uL (ref 0.1–1.0)
Monocytes Relative: 9 %
NEUTROS ABS: 9.2 10*3/uL — AB (ref 1.7–7.7)
Neutrophils Relative %: 82 %
PLATELETS: 186 10*3/uL (ref 150–400)
RBC: 2.79 MIL/uL — ABNORMAL LOW (ref 3.87–5.11)
RDW: 14.2 % (ref 11.5–15.5)
WBC: 11.2 10*3/uL — ABNORMAL HIGH (ref 4.0–10.5)

## 2015-10-11 LAB — MAGNESIUM: Magnesium: 2 mg/dL (ref 1.7–2.4)

## 2015-10-11 MED ORDER — SODIUM CHLORIDE 0.9 % IJ SOLN
10.0000 mL | INTRAMUSCULAR | Status: DC | PRN
  Administered 2015-10-14: 10 mL
  Administered 2015-10-15: 20 mL
  Administered 2015-10-15: 10 mL
  Filled 2015-10-11 (×3): qty 40

## 2015-10-11 MED ORDER — ACETAMINOPHEN 500 MG PO TABS
500.0000 mg | ORAL_TABLET | Freq: Four times a day (QID) | ORAL | Status: DC | PRN
  Administered 2015-10-11 – 2015-10-13 (×2): 1000 mg via ORAL
  Filled 2015-10-11 (×3): qty 2

## 2015-10-11 MED ORDER — METOPROLOL TARTRATE 1 MG/ML IV SOLN
5.0000 mg | Freq: Four times a day (QID) | INTRAVENOUS | Status: AC
  Administered 2015-10-11 – 2015-10-12 (×5): 5 mg via INTRAVENOUS
  Filled 2015-10-11 (×6): qty 5

## 2015-10-11 MED ORDER — METOPROLOL SUCCINATE ER 25 MG PO TB24
25.0000 mg | ORAL_TABLET | Freq: Every day | ORAL | Status: DC
  Administered 2015-10-11 – 2015-10-12 (×2): 25 mg via ORAL
  Filled 2015-10-11 (×2): qty 1

## 2015-10-11 MED ORDER — SODIUM CHLORIDE 0.9 % IJ SOLN
10.0000 mL | Freq: Two times a day (BID) | INTRAMUSCULAR | Status: DC
  Administered 2015-10-11: 10 mL

## 2015-10-11 NOTE — Progress Notes (Signed)
Pt. Restless, states she cannot sleep.  Pt asked if she would like something to help rest.  Pt states "anything to help me get some rest".  NP on call notified and ambien ordered.  Will give and continue to monitor pt.

## 2015-10-11 NOTE — Procedures (Signed)
Pt placed on home BiPAP by RN.  RT checked pt.  Pt is comfortable and resting well.

## 2015-10-11 NOTE — Clinical Documentation Improvement (Signed)
Internal Medicine  Can the diagnosis of CHF be further specified?    Acuity - Acute, Chronic, Acute on Chronic   Type - Systolic, Diastolic, Systolic and Diastolic  Other  Clinically Undetermined  Document any associated diagnoses/conditions  Please update your documentation within the medical record to reflect your response to this query. Thank you.  Supporting Information:(As per notes) "Acute exacerbation of Heart failure [facility](HCC): Patient reported shortness of breath following recent surgery."   Please exercise your independent, professional judgment when responding. A specific answer is not anticipated or expected.   Thank You, Nevin BloodgoodJoan B Zachory Mangual, RN, BSN, CCDS,Clinical Documentation Specialist:  [phone]  [phone]=[place]- Health Information Management

## 2015-10-11 NOTE — Progress Notes (Signed)
Pt continuously calling out for family members, hanging legs off side of bed, taking off oxygen, pulling leads off.  Pt educated on safety, and importance of oxygen.  Pt disoriented to place, but does not reorient like before.  Pt is frequently attempting to get out of bed and is in danger of self harm.  Pt will not keep oxygen on and has episodes of desaturation without O2.  NP notified of situation and restlessness of pt.  Ativan ordered.  Will give and continue to monitor.

## 2015-10-11 NOTE — Progress Notes (Signed)
Pt disoriented to place.  Pt continuously takes off Nasal Cannula or BiPAP, attempts to get out of bed.  Pt educated on importance of rest and O2, especially BiPAP at night.  Bed alarm on and this RN outside of room.  Will continue to monitor.

## 2015-10-11 NOTE — Progress Notes (Signed)
[place] TEAM 1 - Stepdown/ICU TEAM PROGRESS NOTE  Kelly Garner WUJ:[id number] DOB: [birth year] DOA: 10/08/2015 PCP: No primary care provider on file.  Admit HPI / Brief Narrative: 81 year old female with a history of PVD, CHF, CAD with three-vessel CABG with report of cath in August of 2016, CVA 5 years ago who initially presented to [facility] ED with complaints of fever, chills and shortness of breath. Patient was status post a Fem-pop bypass on 10/04/2015 at [facility] by Dr. Keith Rake. Patient apparently was discharged home on 10/07/2015.  Initial findings included a chest x-ray showing the possibility of CHF/COPD, a BNP of 1187, temperature of 101.66F, and the patient was found to be in A. fib with heart rates in the 150s. She was started on a diltiazem drip along with Vanco and Zosyn empirically. She was transferred to [facility].  HPI/Subjective: The patient is much more stable today.  Her heart rate is currently well-controlled.  She is alert oriented and conversant.  She denies chest pain shortness breath fevers chills nausea or vomiting.  Assessment/Plan:  SIRS v/s Sepsis unspecified organism temp 101.66F, HR 130, WBC 12.5 initially at outside facility - no clear infection source known at this time - empiric antibiotics started 12/8 - ? Left basilar infiltrate on most recent chest x-ray  Hypotension  Resolved - follow closely in step down unit  Hx of Parox Atrial fibrillation with acute RVR  TSH normal - previously on coumadin (stopped due to ICH) - attempt to wean Cardizem drip today  Status post femoral-popliteal bypass surgery monitor site for any signs of infection   Mild chronic systolic CHF  TTE this admit notes EF 50-55% w/ normal WMA but unable to comment on diastolic fxn - volume status essentially balance since admission - no clinical evidence of volume overload presently  Wheezing/SOB Resolved at this time  Leukocytosis Resolved then recurred but patient  dosed with steroids - now on empiric antibiotics  Hypokalemia  Corrected  Acute renal failure  crt was normal at 0.87 12/4 per Care Everywhere - crt now climbing - stop vancomycin without clear evidence of MRSA infection  Altered mental status Appears most consistent with sundowning - avoid benzodiazepines or Ambien  Normocytic anemia  Goal Hgb should be 8.0 - Hgb was 8.8 on 12/4 per Care Everywhere, but preop was as high as 10.5    Peripheral vascular disease  -Restart home meds  Coronary artery disease status post three-vessel CABG there is note of a recent cath in August 2016 - records are not available at this time  Hx of hemorrhagic CVA in 2009  Code Status: FULL Family Communication: no family present at time of exam Disposition Plan: SDU  Consultants: none  Procedures: TTE - 12/7 - as noted above  Antibiotics: Zosyn 12/8 > Vancomycin 12/8 > 12/9  DVT prophylaxis: lovenox   Objective: Blood pressure 117/64, pulse 102, temperature 98.2 F (36.8 C), temperature source Oral, resp. rate 18, height  (1.6 m), weight 92 kg (202 lb 13.2 oz), SpO2 94 %.  Intake/Output Summary (Last 24 hours) at 10/11/15 1046 Last data filed at 10/11/15 0900  Gross per 24 hour  Intake 591.67 ml  Output   1185 ml  Net -593.33 ml   Exam: General: No acute respiratory distress - alert and conversant Lungs: Mild bibasilar crackles with good air movement throughout other fields and no wheeze Cardiovascular: Regular rate without murmur gallop or rub  Abdomen: Nontender, nondistended, soft, bowel sounds positive, no rebound,  no ascites, no appreciable mass Extremities: No significant cyanosis, clubbing, or edema bilateral lower extremities   Data Reviewed: Basic Metabolic Panel:  Recent Labs Lab 10/09/15 0545 10/09/15 1645 10/10/15 0600 10/10/15 0930 10/11/15 0512  NA 138  --  139  --  138  K 3.2*  --  3.8  --  3.6  CL 104  --  103  --  104  CO2 23  --  23  --  24    GLUCOSE 121*  --  166*  --  175*  BUN 13  --  15  --  30*  CREATININE 1.26*  --  0.96  --  1.28*  CALCIUM 8.4*  --  9.1  --  9.0  MG  --  1.4*  --  1.7 2.0    CBC:  Recent Labs Lab 10/09/15 0545 10/09/15 1645 10/10/15 0600 10/11/15 0512  WBC 8.6 11.7* 14.4* 11.2*  NEUTROABS 5.0  --   --  9.2*  HGB 7.8* 9.3* 10.8* 9.1*  HCT 23.7* 27.7* 32.2* 26.4*  MCV 95.6 94.9 94.4 94.6  PLT 150 195 194 186    Liver Function Tests:  Recent Labs Lab 10/10/15 0600  AST 27  ALT 19  ALKPHOS 63  BILITOT 1.7*  PROT 7.4  ALBUMIN 2.7*    Coags:  Recent Labs Lab 10/10/15 1045  INR 1.40    Recent Labs Lab 10/10/15 1045  APTT 33    Recent Results (from the past 240 hour(s))  MRSA PCR Screening     Status: None   Collection Time: 10/09/15  4:56 AM  Result Value Ref Range Status   MRSA by PCR NEGATIVE NEGATIVE Final    Comment:        The GeneXpert MRSA Assay (FDA approved for NASAL specimens only), is one component of a comprehensive MRSA colonization surveillance program. It is not intended to diagnose MRSA infection nor to guide or monitor treatment for MRSA infections.   Culture, Urine     Status: None   Collection Time: 10/10/15 10:12 AM  Result Value Ref Range Status   Specimen Description URINE, CATHETERIZED  Final   Special Requests NONE  Final   Culture NO GROWTH 1 DAY  Final   Report Status 10/11/2015 FINAL  Final     Studies:   Recent x-ray studies have been reviewed in detail by the Attending Physician  Scheduled Meds:  Scheduled Meds: . antiseptic oral rinse  7 mL Mouth Rinse BID  . budesonide  0.5 mg Nebulization BID  . cilostazol  100 mg Oral BID  . clopidogrel  75 mg Oral Daily  . enoxaparin (LOVENOX) injection  40 mg Subcutaneous Q24H  . folic acid  1 mg Oral BID  . methylPREDNISolone (SOLU-MEDROL) injection  60 mg Intravenous Q24H  . metoprolol  10 mg Intravenous 4 times per day  . piperacillin-tazobactam (ZOSYN)  IV  3.375 g  Intravenous 3 times per day  . vancomycin  1,250 mg Intravenous Q24H    Time spent on care of this patient: 35mins   Kelly Garner , MD   [facility] Office  [phone] Pager - Text Page per Loretha StaplerAmion as per below:  On-Call/Text Page:      [email or web address]      password [email or web address]  If 7PM-7AM, please contact night-coverage [email or web address] Password [email or web address] 10/11/2015, 10:46 AM   LOS: 3 days

## 2015-10-11 NOTE — Progress Notes (Signed)
Peripherally Inserted Central Catheter/Midline Placement  The IV Nurse has discussed with the patient and/or persons authorized to consent for the patient, the purpose of this procedure and the potential benefits and risks involved with this procedure.  The benefits include less needle sticks, lab draws from the catheter and patient may be discharged home with the catheter.  Risks include, but not limited to, infection, bleeding, blood clot (thrombus formation), and puncture of an artery; nerve damage and irregular heat beat.  Alternatives to this procedure were also discussed.  PICC/Midline Placement Documentation  PICC Double Lumen 10/11/15 PICC Right Basilic 41 cm 0 cm (Active)  Indication for Insertion or Continuance of Line Poor Vasculature-patient has had multiple peripheral attempts or PIVs lasting less than 24 hours 10/11/2015  2:00 PM  Exposed Catheter (cm) 0 cm 10/11/2015  2:00 PM  Dressing Change Due 10/18/15 10/11/2015  2:00 PM   Telephone consent    Stacie GlazeJoyce, Zylie Mumaw Horton 10/11/2015, 2:45 PM

## 2015-10-12 ENCOUNTER — Inpatient Hospital Stay (HOSPITAL_COMMUNITY): Payer: Medicare Other

## 2015-10-12 LAB — CBC
HCT: 29 % — ABNORMAL LOW (ref 36.0–46.0)
Hemoglobin: 9.9 g/dL — ABNORMAL LOW (ref 12.0–15.0)
MCH: 32.6 pg (ref 26.0–34.0)
MCHC: 34.1 g/dL (ref 30.0–36.0)
MCV: 95.4 fL (ref 78.0–100.0)
PLATELETS: 235 10*3/uL (ref 150–400)
RBC: 3.04 MIL/uL — ABNORMAL LOW (ref 3.87–5.11)
RDW: 14.1 % (ref 11.5–15.5)
WBC: 11.7 10*3/uL — AB (ref 4.0–10.5)

## 2015-10-12 LAB — BASIC METABOLIC PANEL
ANION GAP: 11 (ref 5–15)
BUN: 36 mg/dL — ABNORMAL HIGH (ref 6–20)
CALCIUM: 9.2 mg/dL (ref 8.9–10.3)
CO2: 25 mmol/L (ref 22–32)
Chloride: 104 mmol/L (ref 101–111)
Creatinine, Ser: 1.15 mg/dL — ABNORMAL HIGH (ref 0.44–1.00)
GFR calc Af Amer: 51 mL/min — ABNORMAL LOW (ref >60)
GFR, EST NON AFRICAN AMERICAN: 44 mL/min — AB (ref >60)
GLUCOSE: 160 mg/dL — AB (ref 65–99)
POTASSIUM: 3.7 mmol/L (ref 3.5–5.1)
SODIUM: 140 mmol/L (ref 135–145)

## 2015-10-12 MED ORDER — METOPROLOL SUCCINATE ER 50 MG PO TB24
50.0000 mg | ORAL_TABLET | Freq: Every day | ORAL | Status: DC
  Administered 2015-10-13 – 2015-10-16 (×4): 50 mg via ORAL
  Filled 2015-10-12 (×4): qty 1

## 2015-10-12 NOTE — Progress Notes (Signed)
Placed patient on home CPAP with oxygen set at 2lpm  

## 2015-10-12 NOTE — Progress Notes (Signed)
[name]  Kelly Garner FIE:[id number] DOB: Dec 31, 1932 DOA: 10/08/2015 PCP: No primary care provider on file.  Admit HPI / Brief Narrative: 81 year old female with a history of PVD, CHF, CAD with three-vessel CABG with report of cath in August of 2016, CVA 5 years ago who initially presented to [facility] ED with complaints of fever, chills and shortness of breath. Patient was status post a Fem-pop bypass on 10/04/2015 at [facility] by Dr. Keith Garner. Patient apparently was discharged home on 10/07/2015.  Initial findings included a chest x-ray showing the possibility of CHF/COPD, a BNP of 1187, temperature of 101.66F, and the patient was found to be in A. fib with heart rates in the 150s. She was started on a diltiazem drip along with Vanco and Zosyn empirically. She was transferred to [facility].  HPI/Subjective: The patient is alert and conversant and visiting with her husband and daughter.  She denies chest pain fevers chills shortness of breath or abdominal pain.  She does admit to feeling extremely weak and have a very poor appetite.  Assessment/Plan:  SIRS v/s Sepsis unspecified organism - possible L basilar HCAP  temp 101.66F, HR 130, WBC 12.5 initially at outside facility - no clear infection source known at this time - empiric antibiotics started 12/8 - ? Left basilar infiltrate on most recent chest x-ray  Hypotension  Resolved   Hx of Parox Atrial fibrillation with acute RVR  TSH normal - previously on coumadin (stopped due to ICH) - rate now reasonably controlled off cardizem, but not ideal - follow on tele   Status post femoral-popliteal bypass surgery monitor site for any signs of infection - wounds intact w/o any sign of infection   Mild chronic systolic CHF  TTE this admit notes EF 50-55% w/ normal WMA but unable to comment on diastolic fxn - net negative ~1.3L since admit - no clinical evidence of volume overload  presently  Wheezing/SOB Resolved at this time - noted at time of blood transfusion   Leukocytosis Resolved then recurred but patient dosed with steroids - now on empiric antibiotics  Hypokalemia  Corrected  Acute renal failure  crt was normal at 0.87 12/4 per Care Everywhere - crt improved over last 24hrs - follow   Altered mental status Appears most consistent with sundowning - avoid benzodiazepines or Ambien - daughter notes pt has very poor short term memory at baseline   Normocytic anemia  Goal Hgb should be 8.0 - Hgb was 8.8 on 12/4 per Care Everywhere, but preop was as high as 10.5 - currently climbing    Peripheral vascular disease  -continue home meds - wounds stable   Coronary artery disease status post three-vessel CABG there is note of a recent cath in August 2016 - records are not available at this time - no chest pain   Hx of hemorrhagic CVA in 2009  Code Status: FULL Family Communication: Spoke with husband and daughter at bedside at length Disposition Plan: SDU - anticipate transfer to telemetry bed tomorrow - begin PT/OT - patient aware a SNF rehabilitation stay may be required  Consultants: none  Procedures: TTE - 12/7 - as noted above  Antibiotics: Zosyn 12/8 > Vancomycin 12/8 > 12/9  DVT prophylaxis: lovenox   Objective: Blood pressure 154/78, pulse 92, temperature 97.6 F (36.4 C), temperature source Oral, resp. rate 22, height  (1.6 m), weight 91.763 kg (202 lb 4.8 oz), SpO2 94 %.  Intake/Output Summary (Last 24  hours) at 10/12/15 1407 Last data filed at 10/12/15 0900  Gross per 24 hour  Intake    120 ml  Output    650 ml  Net   -530 ml   Exam: General: No acute respiratory distress - alert/conversant Lungs: Mild bibasilar crackles with good air movement throughout other fields Cardiovascular: Regular rate without murmur gallop rub  Abdomen: Nontender, nondistended, soft, bowel sounds positive, no rebound, no ascites, no  appreciable mass Extremities: No significant cyanosis, clubbing, edema bilateral lower extremities   Data Reviewed: Basic Metabolic Panel:  Recent Labs Lab 10/09/15 0545 10/09/15 1645 10/10/15 0600 10/10/15 0930 10/11/15 0512 10/12/15 0523  NA 138  --  139  --  138 140  K 3.2*  --  3.8  --  3.6 3.7  CL 104  --  103  --  104 104  CO2 23  --  23  --  24 25  GLUCOSE 121*  --  166*  --  175* 160*  BUN 13  --  15  --  30* 36*  CREATININE 1.26*  --  0.96  --  1.28* 1.15*  CALCIUM 8.4*  --  9.1  --  9.0 9.2  MG  --  1.4*  --  1.7 2.0  --     CBC:  Recent Labs Lab 10/09/15 0545 10/09/15 1645 10/10/15 0600 10/11/15 0512 10/12/15 0523  WBC 8.6 11.7* 14.4* 11.2* 11.7*  NEUTROABS 5.0  --   --  9.2*  --   HGB 7.8* 9.3* 10.8* 9.1* 9.9*  HCT 23.7* 27.7* 32.2* 26.4* 29.0*  MCV 95.6 94.9 94.4 94.6 95.4  PLT 150 195 194 186 235    Liver Function Tests:  Recent Labs Lab 10/10/15 0600  AST 27  ALT 19  ALKPHOS 63  BILITOT 1.7*  PROT 7.4  ALBUMIN 2.7*    Coags:  Recent Labs Lab 10/10/15 1045  INR 1.40    Recent Labs Lab 10/10/15 1045  APTT 33    Recent Results (from the past 240 hour(s))  MRSA PCR Screening     Status: None   Collection Time: 10/09/15  4:56 AM  Result Value Ref Range Status   MRSA by PCR NEGATIVE NEGATIVE Final    Comment:        The GeneXpert MRSA Assay (FDA approved for NASAL specimens only), is one component of a comprehensive MRSA colonization surveillance program. It is not intended to diagnose MRSA infection nor to guide or monitor treatment for MRSA infections.   Culture, Urine     Status: None   Collection Time: 10/10/15 10:12 AM  Result Value Ref Range Status   Specimen Description URINE, CATHETERIZED  Final   Special Requests NONE  Final   Culture NO GROWTH 1 DAY  Final   Report Status 10/11/2015 FINAL  Final  Culture, blood (routine x 2)     Status: None (Preliminary result)   Collection Time: 10/10/15 10:45 AM   Result Value Ref Range Status   Specimen Description BLOOD RIGHT ANTECUBITAL  Final   Special Requests BOTTLES DRAWN AEROBIC AND ANAEROBIC 5CC  Final   Culture NO GROWTH 2 DAYS  Final   Report Status PENDING  Incomplete  Culture, blood (routine x 2)     Status: None (Preliminary result)   Collection Time: 10/10/15 10:50 AM  Result Value Ref Range Status   Specimen Description BLOOD RIGHT HAND  Final   Special Requests IN PEDIATRIC BOTTLE 3CC  Final   Culture  <BADTNorfolk Tresa EndoR Drill ng [id number] od<BADTEXDoreatha MartinG>lph [id number] LeasNoxuAngola E66 ther [id number] ne Drill ngwngo [phone]>

## 2015-10-12 NOTE — Care Management Note (Signed)
Case Management Note  Patient Details  Name: Kelly Garner MRN: [id number] Date of Birth: 06-May-1933  Subjective/Objective:                Admitted with sob/fever/chills, history of PVD, CHF, CAD with three-vessel CABG, CVA ,  [facility] Dr[facility] Keith RakeJames Garner was discharged home on 10/07/2015.    Action/Plan: Return to home when medically stable. CM to f/u with disposition needs.  Expected Discharge Date:                  Expected Discharge Plan:  Home w Home Health Services  In-House Referral:     Discharge planning Services  CM Consult  Post Acute Care Choice:    Choice offered to:     DME Arranged:    DME Agency:     HH Arranged:    HH Agency:     Status of Service:  In process, will continue to follow  Medicare Important Message Given:    Date Medicare IM Given:    Medicare IM give by:    Date Additional Medicare IM Given:    Additional Medicare Important Message give by:     If discussed at Long Length of Stay Meetings, dates discussed:    Additional Comments: Kelly Garner (Daughter)  [phone]  Kelly Garner, Kelly Garner, NevadaRN,BSN,CN [phone] 10/12/2015, 5:31 PM

## 2015-10-13 DIAGNOSIS — I5022 Chronic systolic (congestive) heart failure: Secondary | ICD-10-CM

## 2015-10-13 LAB — COMPREHENSIVE METABOLIC PANEL
ALT: 66 U/L — ABNORMAL HIGH (ref 14–54)
ANION GAP: 9 (ref 5–15)
AST: 84 U/L — ABNORMAL HIGH (ref 15–41)
Albumin: 2.4 g/dL — ABNORMAL LOW (ref 3.5–5.0)
Alkaline Phosphatase: 61 U/L (ref 38–126)
BUN: 26 mg/dL — ABNORMAL HIGH (ref 6–20)
CALCIUM: 8.8 mg/dL — AB (ref 8.9–10.3)
CHLORIDE: 107 mmol/L (ref 101–111)
CO2: 25 mmol/L (ref 22–32)
Creatinine, Ser: 1.04 mg/dL — ABNORMAL HIGH (ref 0.44–1.00)
GFR calc non Af Amer: 49 mL/min — ABNORMAL LOW (ref >60)
GFR, EST AFRICAN AMERICAN: 57 mL/min — AB (ref >60)
Glucose, Bld: 146 mg/dL — ABNORMAL HIGH (ref 65–99)
Potassium: 3.1 mmol/L — ABNORMAL LOW (ref 3.5–5.1)
SODIUM: 141 mmol/L (ref 135–145)
Total Bilirubin: 0.8 mg/dL (ref 0.3–1.2)
Total Protein: 6.5 g/dL (ref 6.5–8.1)

## 2015-10-13 LAB — CBC
HCT: 29.2 % — ABNORMAL LOW (ref 36.0–46.0)
HEMOGLOBIN: 9.8 g/dL — AB (ref 12.0–15.0)
MCH: 32.2 pg (ref 26.0–34.0)
MCHC: 33.6 g/dL (ref 30.0–36.0)
MCV: 96.1 fL (ref 78.0–100.0)
Platelets: 246 10*3/uL (ref 150–400)
RBC: 3.04 MIL/uL — AB (ref 3.87–5.11)
RDW: 14 % (ref 11.5–15.5)
WBC: 8.3 10*3/uL (ref 4.0–10.5)

## 2015-10-13 MED ORDER — ATORVASTATIN CALCIUM 40 MG PO TABS
40.0000 mg | ORAL_TABLET | Freq: Every day | ORAL | Status: DC
  Administered 2015-10-13 – 2015-10-16 (×4): 40 mg via ORAL
  Filled 2015-10-13 (×4): qty 1

## 2015-10-13 MED ORDER — HALOPERIDOL LACTATE 5 MG/ML IJ SOLN: 2.0000 mg | Freq: Four times a day (QID) | INTRAMUSCULAR | Status: DC | PRN

## 2015-10-13 MED ORDER — METHOCARBAMOL 1000 MG/10ML IJ SOLN
500.0000 mg | Freq: Once | INTRAMUSCULAR | Status: AC
  Administered 2015-10-13: 500 mg via INTRAVENOUS
  Filled 2015-10-13: qty 5

## 2015-10-13 MED ORDER — AMLODIPINE BESYLATE 5 MG PO TABS
5.0000 mg | ORAL_TABLET | Freq: Every day | ORAL | Status: DC
  Administered 2015-10-13 – 2015-10-16 (×4): 5 mg via ORAL
  Filled 2015-10-13 (×4): qty 1

## 2015-10-13 MED ORDER — POTASSIUM CHLORIDE CRYS ER 20 MEQ PO TBCR
40.0000 meq | EXTENDED_RELEASE_TABLET | Freq: Once | ORAL | Status: AC
  Administered 2015-10-13: 40 meq via ORAL
  Filled 2015-10-13: qty 2

## 2015-10-13 NOTE — Progress Notes (Signed)
UR COMPLETED  

## 2015-10-13 NOTE — Evaluation (Signed)
Physical Therapy Evaluation Patient Details Name: Kelly Garner MRN: [id number] DOB: 28-Mar-1933 Today's Date: 10/13/2015   History of Present Illness  81 year old female with a history of PVD, CHF, CAD with three-vessel CABG with report of cath in August of 2016, CVA 5 years ago who initially presented to [facility] ED with complaints of fever, chills and shortness of breath. Patient was status post a Fem-pop bypass on 10/04/2015 at [facility] by Dr. Keith Rake. Patient apparently was discharged home on 10/07/2015. Initial findings included a chest x-ray showing the possibility of CHF/COPD, a BNP of 1187, temperature of 101.66F, and the patient was found to be in A. fib with heart rates in the 150s. SIRS/sepsis, pneumonia;  Transferred to [facility]  Clinical Impression   Pt admitted with above diagnosis. Pt currently with functional limitations due to the deficits listed below (see PT Problem List).  Pt will benefit from skilled PT to increase their independence and safety with mobility to allow discharge to the venue listed below.       Follow Up Recommendations Home health PT;Supervision/Assistance - 24 hour    Equipment Recommendations  Rolling walker with 5" wheels;3in1 (PT) (I think she already has)    Recommendations for Other Services OT consult     Precautions / Restrictions Precautions Precautions: Fall Precaution Comments: Fall risk greatly reduced with use of RW      Mobility  Bed Mobility Overal bed mobility: Needs Assistance Bed Mobility: Supine to Sit     Supine to sit: Min assist     General bed mobility comments: min handheld assist to pull to sit  Transfers Overall transfer level: Needs assistance Equipment used: Rolling walker (2 wheeled) Transfers: Sit to/from Stand Sit to Stand: Min assist         General transfer comment: min assist to steady; cues for safety and hand placement  Ambulation/Gait Ambulation/Gait assistance: Min assist;+2  safety/equipment Ambulation Distance (Feet): 100 Feet Assistive device: Rolling walker (2 wheeled) Gait Pattern/deviations: Step-through pattern;Decreased stride length;Decreased step length - right;Decreased step length - left   Gait velocity interpretation: Below normal speed for age/gender General Gait Details: Cues to self-monitor for activity tolerance  Stairs            Wheelchair Mobility    Modified Rankin (Stroke Patients Only)       Balance                                             Pertinent Vitals/Pain Pain Assessment: No/denies pain    Home Living Family/patient expects to be discharged to:: Private residence Living Arrangements: Spouse/significant other Available Help at Discharge: Family;Available 24 hours/day Type of Home: House Home Access: Level entry (to level where she will be staying)     Home Layout: Two level;Able to live on main level with bedroom/bathroom Home Equipment: Dan Humphreys - 2 wheels      Prior Function Level of Independence: Independent               Hand Dominance        Extremity/Trunk Assessment   Upper Extremity Assessment: Overall WFL for tasks assessed           Lower Extremity Assessment: Generalized weakness         Communication   Communication: No difficulties  Cognition Arousal/Alertness: Awake/alert Behavior During Therapy: WFL for tasks assessed/performed Overall Cognitive Status:  Within Functional Limits for tasks assessed                      General Comments General comments (skin integrity, edema, etc.): HR ranged 104-137 during amb; walked on Room air, and O2 sats decr to 88% observed lowest    Exercises        Assessment/Plan    PT Assessment Patient needs continued PT services  PT Diagnosis Difficulty walking;Generalized weakness   PT Problem List Decreased strength;Decreased range of motion;Decreased activity tolerance;Decreased balance;Decreased  mobility;Decreased knowledge of use of DME;Decreased knowledge of precautions;Cardiopulmonary status limiting activity  PT Treatment Interventions DME instruction;Gait training;Stair training;Functional mobility training;Therapeutic activities;Therapeutic exercise;Balance training;Patient/family education   PT Goals (Current goals can be found in the Care Plan section) Acute Rehab PT Goals Patient Stated Goal: home soon PT Goal Formulation: With patient Time For Goal Achievement: 10/27/15 Potential to Achieve Goals: Good    Frequency Min 3X/week   Barriers to discharge        Co-evaluation               End of Session Equipment Utilized During Treatment: Gait belt Activity Tolerance: Patient tolerated treatment well Patient left: in chair;with call bell/phone within reach Nurse Communication: Mobility status         Time: [id number] PT Time Calculation (min) (ACUTE ONLY): 24 min   Charges:   PT Evaluation $Initial PT Evaluation Tier I: 1 Procedure PT Treatments $Gait Training: 8-22 mins   PT G CodesOlen Pel:        Rockford Leinen Hamff 10/13/2015, 5:25 PM  Van ClinesHolly Jolisa Intriago, [place] CarolinaPT  Acute Rehabilitation Services Pager [phone] Office [phone]

## 2015-10-13 NOTE — Procedures (Signed)
Pt placed on home BiPAP machine for the evening with oxygen.  Pt is tolerating well and resting comfortably.

## 2015-10-13 NOTE — Progress Notes (Addendum)
[place] TEAM 1 - Stepdown/ICU TEAM PROGRESS NOTE  Kelly SimmerBetty Garner ZOX:[id number] DOB: Feb 22, 1933 DOA: 10/08/2015 PCP: No primary care provider on file.  Admit HPI / Brief Narrative: 81 year old female with a history of PVD, CHF, CAD with three-vessel CABG with report of cath in August of 2016, CVA 5 years ago who [facility] with complaints of fever, chills and shortness of breath. Patient was [facility] Dr[facility] Keith RakeJames Drougas. Patient apparently was discharged home on 10/07/2015.  Initial findings included a chest x-ray showing the possibility of CHF/COPD, a BNP of 1187, temperature of 101.56F, and the patient was found to be in A. fib with heart rates in the 150s. She was started on a diltiazem drip along with Vanco and Zosyn empirically. [facility].  HPI/Subjective: The patient is examined sitting in a bedside chair.  She is alert and conversant.  She denies any new complaints today.  She states she feels weak in general.  She denies chest pain shortness of breath nausea vomiting or abdominal pain.  Assessment/Plan:  SIRS v/s Sepsis unspecified organism - possible L basilar HCAP  temp 101.56F, HR 130, WBC 12.5 initially at outside facility - empiric antibiotics started 12/8 - ? Left basilar infiltrate on most recent chest x-ray - treating as a L basilar HCAP - plan to complete 5 days of zosyn 12/12  Hypotension  Resolved - pt now hypertensive   Hx of Parox Atrial fibrillation with acute RVR  TSH normal - previously on coumadin (stopped due to ICH) - rate now reasonably controlled off cardizem - follow on tele - titrate meds further as tolerated   Status post femoral-popliteal bypass surgery monitor site for any signs of infection - wounds intact w/o any sign of infection   Mild chronic systolic CHF  TTE this admit notes EF 50-55% w/ normal WMA but unable to comment on diastolic fxn - net  negative ~1.4L since admit - no clinical evidence of volume overload presently  Wheezing/SOB Resolved at this time - noted at time of blood transfusion   Leukocytosis Resolved then recurred but patient dosed with steroids - now on empiric antibiotics - WBC has normalized again   Hypokalemia  replace and follow - due to diuretic in face of poor oral intake   Acute renal failure  crt was normal at 0.87 12/4 per Care Everywhere - crt continues to improve   Altered mental status Appears most consistent with sundowning - avoid benzodiazepines or Ambien - daughter notes pt has very poor short term memory at baseline   Normocytic anemia  Goal Hgb should be 8.0 - Hgb was 8.8 on 12/4 per Care Everywhere, but preop was as high as 10.5 - currently stable    Peripheral vascular disease  -continue home meds - wounds stable   Coronary artery disease status post three-vessel CABG there is note of a recent cath in August 2016 - records are not available at this time - no chest pain   Hx of hemorrhagic CVA in 2009  Code Status: FULL Family Communication: No family present at time of visit today Disposition Plan: transfer to telemetry bed - begin PT/OT - patient aware a SNF rehabilitation stay may be required  Consultants: none  Procedures: TTE - 12/7 - as noted above  Antibiotics: Zosyn 12/8 > Vancomycin 12/8 > 12/9  DVT prophylaxis: lovenox   Objective: Blood pressure 160/79, pulse 76, temperature 98.1 F (  36.7 C), temperature source Oral, resp. rate 19, height  (1.6 m), weight 92.1 kg (203 lb 0.7 oz), SpO2 97 %.  Intake/Output Summary (Last 24 hours) at 10/13/15 1157 Last data filed at 10/13/15 0753  Gross per 24 hour  Intake    860 ml  Output   1375 ml  Net   -515 ml   Exam: General: No acute respiratory distress - alert Lungs: Good air movement throughout with no wheezes Cardiovascular: Regular rate without murmur Abdomen: Nontender, nondistended, soft, bowel  sounds positive, no rebound, no ascites, no appreciable mass Extremities: No significant cyanosis, clubbing, or edema bilateral lower extremities   Data Reviewed: Basic Metabolic Panel:  Recent Labs Lab 10/09/15 0545 10/09/15 1645 10/10/15 0600 10/10/15 0930 10/11/15 0512 10/12/15 0523 10/13/15 0338  NA 138  --  139  --  138 140 141  K 3.2*  --  3.8  --  3.6 3.7 3.1*  CL 104  --  103  --  104 104 107  CO2 23  --  23  --  GLUCOSE 121*  --  166*  --  175* 160* 146*  BUN 13  --  15  --  30* 36* 26*  CREATININE 1.26*  --  0.96  --  1.28* 1.15* 1.04*  CALCIUM 8.4*  --  9.1  --  9.0 9.2 8.8*  MG  --  1.4*  --  1.7 2.0  --   --     CBC:  Recent Labs Lab 10/09/15 0545 10/09/15 1645 10/10/15 0600 10/11/15 0512 10/12/15 0523 10/13/15 0338  WBC 8.6 11.7* 14.4* 11.2* 11.7* 8.3  NEUTROABS 5.0  --   --  9.2*  --   --   HGB 7.8* 9.3* 10.8* 9.1* 9.9* 9.8*  HCT 23.7* 27.7* 32.2* 26.4* 29.0* 29.2*  MCV 95.6 94.9 94.4 94.6 95.4 96.1  PLT 150 195 194 186 235 246    Liver Function Tests:  Recent Labs Lab 10/10/15 0600 10/13/15 0338  AST 27 84*  ALT 19 66*  ALKPHOS 63 61  BILITOT 1.7* 0.8  PROT 7.4 6.5  ALBUMIN 2.7* 2.4*    Coags:  Recent Labs Lab 10/10/15 1045  INR 1.40    Recent Labs Lab 10/10/15 1045  APTT 33    Recent Results (from the past 240 hour(s))  MRSA PCR Screening     Status: None   Collection Time: 10/09/15  4:56 AM  Result Value Ref Range Status   MRSA by PCR NEGATIVE NEGATIVE Final    Comment:        The GeneXpert MRSA Assay (FDA approved for NASAL specimens only), is one component of a comprehensive MRSA colonization surveillance program. It is not intended to diagnose MRSA infection nor to guide or monitor treatment for MRSA infections.   Culture, Urine     Status: None   Collection Time: 10/10/15 10:12 AM  Result Value Ref Range Status   Specimen Description URINE, CATHETERIZED  Final   Special Requests NONE  Final    Culture NO GROWTH 1 DAY  Final   Report Status 10/11/2015 FINAL  Final  Culture, blood (routine x 2)     Status: None (Preliminary result)   Collection Time: 10/10/15 10:45 AM  Result Value Ref Range Status   Specimen Description BLOOD RIGHT ANTECUBITAL  Final   Special Requests BOTTLES DRAWN AEROBIC AND ANAEROBIC 5CC  Final   Culture NO GROWTH 3 DAYS  Final   Report  Status PENDING  Incomplete  Culture, blood (routine x 2)     Status: None (Preliminary result)   Collection Time: 10/10/15 10:50 AM  Result Value Ref Range Status   Specimen Description BLOOD RIGHT HAND  Final   Special Requests IN PEDIATRIC BOTTLE 3CC  Final   Culture NO GROWTH 3 DAYS  Final   Report Status PENDING  Incomplete     Studies:   Recent x-ray studies have been reviewed in detail by the Attending Physician  Scheduled Meds:  Scheduled Meds: . antiseptic oral rinse  7 mL Mouth Rinse BID  . budesonide  0.5 mg Nebulization BID  . clopidogrel  75 mg Oral Daily  . enoxaparin (LOVENOX) injection  40 mg Subcutaneous Q24H  . folic acid  1 mg Oral BID  . metoprolol succinate  50 mg Oral Daily  . piperacillin-tazobactam (ZOSYN)  IV  3.375 g Intravenous 3 times per day    Time spent on care of this patient:   Lonia Blood , MD   [facility] Office  [phone] Pager - Text Page per Loretha Stapler as per below:  On-Call/Text Page:      [email or web address]      password [email or web address]  If 7PM-7AM, please contact night-coverage [email or web address] Password [email or web address] 10/13/2015, 11:57 AM   LOS: 5 days

## 2015-10-13 NOTE — Progress Notes (Signed)
Pt report called to [facility] RN. VSS, All meds current to time. Family at bedside and aware of new room.

## 2015-10-14 LAB — BASIC METABOLIC PANEL
Anion gap: 9 (ref 5–15)
BUN: 15 mg/dL (ref 6–20)
CHLORIDE: 103 mmol/L (ref 101–111)
CO2: 27 mmol/L (ref 22–32)
CREATININE: 0.92 mg/dL (ref 0.44–1.00)
Calcium: 9 mg/dL (ref 8.9–10.3)
GFR calc non Af Amer: 57 mL/min — ABNORMAL LOW (ref >60)
Glucose, Bld: 107 mg/dL — ABNORMAL HIGH (ref 65–99)
POTASSIUM: 3.5 mmol/L (ref 3.5–5.1)
SODIUM: 139 mmol/L (ref 135–145)

## 2015-10-14 MED ORDER — ACETAMINOPHEN 500 MG PO TABS
500.0000 mg | ORAL_TABLET | Freq: Four times a day (QID) | ORAL | Status: DC | PRN
  Administered 2015-10-15: 500 mg via ORAL
  Filled 2015-10-14: qty 1

## 2015-10-14 NOTE — Progress Notes (Signed)
ANTIBIOTIC CONSULT NOTE - Follow-Up  Pharmacy Consult for vanc/zosyn Indication: sepsis  Allergies  Allergen Reactions  . Aspirin Swelling and Rash  . Ambien [Zolpidem Tartrate] Other (See Comments)    confusion  . Macrobid [employer] Macro] Other (See Comments)    Affects lungs  . Codeine Rash  . Other Rash    Metal underneath watch caused redness to skin    Patient Measurements: Height:  (160 cm) Weight: 192 lb 1.6 oz (87.136 kg) IBW/kg (Calculated) : 52.4   Vital Signs: Temp: 98.4 F (36.9 C) (12/12 1027) Temp Source: Oral (12/12 1027) BP: 147/97 mmHg (12/12 1027) Pulse Rate: 98 (12/12 1027) Intake/Output from previous day: 12/11 0701 - 12/12 0700 In: 550 [P.O.:360; I.V.:90; IV Piggyback:100] Out: 2500 [Urine:2500] Intake/Output from this shift: Total I/O In: 60 [P.O.:60] Out: 300 [Urine:300]  Labs:  Recent Labs  10/12/15 0523 10/13/15 0338 10/14/15 0526  WBC 11.7* 8.3  --   HGB 9.9* 9.8*  --   PLT 235 246  --   CREATININE 1.15* 1.04* 0.92   Estimated Creatinine Clearance: 51 mL/min (by C-G formula based on Cr of 0.92). No results for input(s): VANCOTROUGH, VANCOPEAK, VANCORANDOM, GENTTROUGH, GENTPEAK, GENTRANDOM, TOBRATROUGH, TOBRAPEAK, TOBRARND, AMIKACINPEAK, AMIKACINTROU, AMIKACIN in the last 72 hours.   Microbiology: Recent Results (from the past 720 hour(s))  MRSA PCR Screening     Status: None   Collection Time: 10/09/15  4:56 AM  Result Value Ref Range Status   MRSA by PCR NEGATIVE NEGATIVE Final    Comment:        The GeneXpert MRSA Assay (FDA approved for NASAL specimens only), is one component of a comprehensive MRSA colonization surveillance program. It is not intended to diagnose MRSA infection nor to guide or monitor treatment for MRSA infections.   Culture, Urine     Status: None   Collection Time: 10/10/15 10:12 AM  Result Value Ref Range Status   Specimen Description URINE, CATHETERIZED  Final   Special  Requests NONE  Final   Culture NO GROWTH 1 DAY  Final   Report Status 10/11/2015 FINAL  Final  Culture, blood (routine x 2)     Status: None (Preliminary result)   Collection Time: 10/10/15 10:45 AM  Result Value Ref Range Status   Specimen Description BLOOD RIGHT ANTECUBITAL  Final   Special Requests BOTTLES DRAWN AEROBIC AND ANAEROBIC 5CC  Final   Culture NO GROWTH 3 DAYS  Final   Report Status PENDING  Incomplete  Culture, blood (routine x 2)     Status: None (Preliminary result)   Collection Time: 10/10/15 10:50 AM  Result Value Ref Range Status   Specimen Description BLOOD RIGHT HAND  Final   Special Requests IN PEDIATRIC BOTTLE 3CC  Final   Culture NO GROWTH 3 DAYS  Final   Report Status PENDING  Incomplete    Medical History: Past Medical History  Diagnosis Date  . Coronary artery disease   . Peripheral vascular disease (HCC)   . Congestive heart failure (CHF) (HCC)   . History of CVA (cerebrovascular accident)   . Atrial fibrillation (HCC)      Assessment: 63 yoF presenting with fever, chills, SOB. [facility] to [facility]. Zosyn D#5/5 for sepsis. Afeb, WBC now wnl (was on steroids)   12/8 Vanc>>12/9  12/8 Zosyn>>(planned 12/12)   12/8 BCx2>> ngtd  12/8 UC>> neg final  MRSA pcr neg  Goal of Therapy:  Eradication of infection  Plan:  Last day of  Zoysn 3.375g IV q8h to complete 5 days of therapy Monitor clinical progress  Remi HaggardAlyson N. Nanci Lakatos, PharmD Clinical Pharmacist- Resident Pager: [phone]   10/14/2015 10:45 AM

## 2015-10-14 NOTE — Evaluation (Signed)
Occupational Therapy Evaluation Patient Details Name: Kelly Garner MRN: [id number] DOB: [birth year] Today's Date: 10/14/2015    History of Present Illness 81 year old female with a history of PVD, CHF, CAD with three-vessel CABG with report of cath in August of 2016, CVA 5 years ago who initially presented to [facility] ED with complaints of fever, chills and shortness of breath. Patient was status post a Fem-pop bypass on 10[facility]04[facility] by Dr. Keith RakeJames Drougas. Patient apparently was discharged home on 10/07/2015. Initial findings included a chest x-ray showing the possibility of CHF/COPD, a BNP of 1187, temperature of 101.53F, and the patient was found to be in A. fib with heart rates in the 150s. SIRS/sepsis, pneumonia;  Transferred to [facility]   Clinical Impression   Pt admitted with above. She demonstrates the below listed deficits and will benefit from continued OT to maximize safety and independence with BADLs.  Pt presents to OT with generalized weakness.  She currently requires min - mod A for ADLs.  Family is supportive.  Anticipate she will be able to discharge home with family support.       Follow Up Recommendations  No OT follow up;Supervision/Assistance - 24 hour    Equipment Recommendations  None recommended by OT    Recommendations for Other Services       Precautions / Restrictions Precautions Precautions: Fall      Mobility Bed Mobility Overal bed mobility: Needs Assistance Bed Mobility: Supine to Sit;Sit to Supine     Supine to sit: Min assist Sit to supine: Min assist   General bed mobility comments: Step by step cues.  Pt grabs for therapist.  She requires assist to lift trunk and assist to scoot up in bed   Transfers Overall transfer level: Needs assistance Equipment used: Rolling walker (2 wheeled) Transfers: Sit to/from UGI CorporationStand;Stand Pivot Transfers Sit to Stand: Min guard Stand pivot transfers: Min guard            Balance Overall  balance assessment: Needs assistance Sitting-balance support: Feet supported Sitting balance-Leahy Scale: Fair     Standing balance support: During functional activity Standing balance-Leahy Scale: Fair                              ADL Overall ADL's : Needs assistance/impaired Eating/Feeding: Modified independent;Bed level   Grooming: Wash/dry hands;Wash/dry face;Oral care;Brushing hair;Min guard;Standing   Upper Body Bathing: Set up;Sitting   Lower Body Bathing: Moderate assistance;Sit to/from stand   Upper Body Dressing : Minimal assistance;Sitting   Lower Body Dressing: Maximal assistance;Sit to/from stand   Toilet Transfer: Min guard;Ambulation;Comfort height toilet;Grab bars;RW   Toileting- Clothing Manipulation and Hygiene: Minimal assistance;Sit to/from stand       Functional mobility during ADLs: Engineer, technical salesMin guard;Rolling walker       Vision     Perception     Praxis      Pertinent Vitals/Pain Pain Assessment: No/denies pain     Hand Dominance     Extremity/Trunk Assessment Upper Extremity Assessment Upper Extremity Assessment: Generalized weakness   Lower Extremity Assessment Lower Extremity Assessment: Defer to PT evaluation   Cervical / Trunk Assessment Cervical / Trunk Assessment: Normal   Communication Communication Communication: No difficulties   Cognition Arousal/Alertness: Awake/alert Behavior During Therapy: WFL for tasks assessed/performed Overall Cognitive Status: History of cognitive impairments - at baseline       Memory: Decreased short-term memory  General Comments       Exercises       Shoulder Instructions      Home Living Family/patient expects to be discharged to:: Private residence Living Arrangements: Spouse/significant other Available Help at Discharge: Family;Available 24 hours/day Type of Home: House Home Access: Level entry (to level where she will be staying)     Home Layout: Two  level;Able to live on main level with bedroom/bathroom     Bathroom Shower/Tub: Producer, television/film/video: Handicapped height Bathroom Accessibility: Yes How Accessible: Accessible via walker Home Equipment: Walker - 2 wheels;Shower seat;Grab bars - tub/shower;Adaptive equipment;Hospital bed;Other (comment) (lift chair ) Adaptive Equipment: Sock aid Additional Comments: Pt will stay in basement level of home upon discharge       Prior Functioning/Environment Level of Independence: Needs assistance  Gait / Transfers Assistance Needed: mod I ADL's / Homemaking Assistance Needed: Pt's spouse intermittently assists her with LB ADLs.  She does have a sock aid, but he assists her with socks and she assists him with socks.  Spouse performs household Risk analyst.         OT Diagnosis: Generalized weakness;Cognitive deficits   OT Problem List: Decreased strength;Decreased activity tolerance;Impaired balance (sitting and/or standing);Decreased cognition;Decreased safety awareness;Decreased knowledge of use of DME or AE;Obesity   OT Treatment/Interventions: Self-care/ADL training;DME and/or AE instruction;Therapeutic activities;Patient/family education;Balance training    OT Goals(Current goals can be found in the care plan section) Acute Rehab OT Goals Patient Stated Goal: to go home  OT Goal Formulation: With patient Time For Goal Achievement: 10/28/15 Potential to Achieve Goals: Good ADL Goals Pt Will Perform Grooming: with supervision;standing Pt Will Perform Upper Body Bathing: with set-up;sitting Pt Will Perform Lower Body Bathing: with supervision;sit to/from stand Pt Will Perform Upper Body Dressing: with supervision;sitting Pt Will Perform Lower Body Dressing: with supervision;sit to/from stand Pt Will Transfer to Toilet: with supervision;ambulating;regular height toilet;bedside commode;grab bars Pt Will Perform Toileting - Clothing Manipulation and hygiene: with  supervision;sit to/from stand  OT Frequency: Min 2X/week   Barriers to D/C:            Co-evaluation              End of Session Equipment Utilized During Treatment: Engineer, water Communication: Mobility status  Activity Tolerance: Patient limited by fatigue Patient left: in bed;with call bell/phone within reach;with bed alarm set   Time: 1610-9604 OT Time Calculation (min): 37 min Charges:  OT General Charges $OT Visit: 1 Procedure OT Evaluation $Initial OT Evaluation Tier I: 1 Procedure OT Treatments $Self Care/Home Management : 8-22 mins G-Codes:    Aeson Sawyers M Nov 06, 2015, 1:49 PM

## 2015-10-14 NOTE — Progress Notes (Signed)
[place] TEAM 1 - Stepdown/ICU TEAM PROGRESS NOTE  Kelly Garner YNW:[id number] DOB: Jul 02, 1933 DOA: 10/08/2015 PCP: No primary care provider on file.  Admit HPI / Brief Narrative: 81 year old female with a history of PVD, CHF, CAD with three-vessel CABG with report of cath in August of 2016, CVA 5 years ago who initially presented to [facility] ED with complaints of fever, chills and shortness of breath. Patient was status post a Fem-pop bypass on 10/04/2015 at [facility] by Dr. Keith Rake. Patient apparently was discharged home on 10/07/2015.  Initial findings included a chest x-ray showing the possibility of CHF/COPD, a BNP of 1187, temperature of 101.32F, and the patient was found to be in A. fib with heart rates in the 150s. She was started on a diltiazem drip along with Vanco and Zosyn empirically. She was transferred to [facility].  HPI/Subjective: The pt is resting comfortably in bed.  She has worked w/ OT today.  She denies cp, sob, n/v, or abdom pain.    Assessment/Plan:  SIRS v/s Sepsis unspecified organism - possible L basilar HCAP  temp 101.32F, HR 130, WBC 12.5 initially at outside facility - empiric antibiotics started 12/8 - ? Left basilar infiltrate on most recent chest x-ray - treating as a L basilar HCAP - plan to complete 5 days of zosyn 12/12  Hypotension  Resolved  Hx of Parox Atrial fibrillation with acute RVR  TSH normal - previously on coumadin (stopped due to ICH) - rate now well controlled - follow on tele   Status post femoral-popliteal bypass surgery monitor site for any signs of infection - wounds intact w/o any sign of infection   Mild chronic systolic CHF  TTE this admit notes EF 50-55% w/ normal WMA but unable to comment on diastolic fxn - net negative ~3.6L since admit - no clinical evidence of volume overload presently - discussed w/ daughter and will plan to return to prior home tx plan wherein the pt weighs daily and takes lasix  QD any time  her weight increases >2 pounds, stopping when her weight returns to baseline   Wheezing/SOB Resolved at this time - noted at time of blood transfusion   Leukocytosis Resolved then recurred but patient dosed with steroids - WBC has normalized again   Hypokalemia  Cont to replace and follow - due to diuretic in face of poor oral intake   Acute renal failure  crt was normal at 0.87 12/4 per Care Everywhere - crt continues to improve and is now approaching normal   Altered mental status Appears most consistent with sundowning - avoid benzodiazepines or Ambien - daughter notes pt has very poor short term memory at baseline - appears stable presently   Normocytic anemia  Goal Hgb should be 8.0 - Hgb was 8.8 on 12/4 per Care Everywhere, but preop was as high as 10.5 - currently stable    Peripheral vascular disease  -continue home meds - wounds stable   Coronary artery disease status post three-vessel CABG there is note of a recent cath in August 2016 - records are not available at this time - no chest pain   Mild transaminitis  Unclear etiology - recheck in AM - lower dose of APAP  Hx of hemorrhagic CVA in 2009  Code Status: FULL Family Communication: No family present at time of visit today - spoke to daughter via telephone 12/12 Disposition Plan: continue PT/OT - cleared for d/c home by therapy w/ HHPT - goal is for d/c home  12/14  Consultants: none  Procedures: TTE - 12/7 - as noted above  Antibiotics: Zosyn 12/8 > 12/12 Vancomycin 12/8 > 12/9  DVT prophylaxis: lovenox   Objective: Blood pressure 158/76, pulse 93, temperature 98.1 F (36.7 C), temperature source Oral, resp. rate 18, height 5\' 3"  (1.6 m), weight 87.136 kg (192 lb 1.6 oz), SpO2 94 %.  Intake/Output Summary (Last 24 hours) at 10/14/15 1328 Last data filed at 10/14/15 1016  Gross per 24 hour  Intake    440 ml  Output   1850 ml  Net  -1410 ml   Exam: General: No respiratory distress - alert and  pleasant  Lungs: Good air movement throughout - no crackles or wheezes Cardiovascular: Regular rate without murmur Abdomen: Nontender, nondistended, soft, bowel sounds positive, no rebound, no ascites, no appreciable mass Extremities: No significant cyanosis, clubbing, edema bilateral lower extremities   Data Reviewed: Basic Metabolic Panel:  Recent Labs Lab 10/09/15 1645 10/10/15 0600 10/10/15 0930 10/11/15 0512 10/12/15 0523 10/13/15 0338 10/14/15 0526  NA  --  139  --  138 140 141 139  K  --  3.8  --  3.6 3.7 3.1* 3.5  CL  --  103  --  104 104 107 103  CO2  --  23  --  24 25 25 27   GLUCOSE  --  166*  --  175* 160* 146* 107*  BUN  --  15  --  30* 36* 26* 15  CREATININE  --  0.96  --  1.28* 1.15* 1.04* 0.92  CALCIUM  --  9.1  --  9.0 9.2 8.8* 9.0  MG 1.4*  --  1.7 2.0  --   --   --     CBC:  Recent Labs Lab 10/09/15 0545 10/09/15 1645 10/10/15 0600 10/11/15 0512 10/12/15 0523 10/13/15 0338  WBC 8.6 11.7* 14.4* 11.2* 11.7* 8.3  NEUTROABS 5.0  --   --  9.2*  --   --   HGB 7.8* 9.3* 10.8* 9.1* 9.9* 9.8*  HCT 23.7* 27.7* 32.2* 26.4* 29.0* 29.2*  MCV 95.6 94.9 94.4 94.6 95.4 96.1  PLT 150 195 194 186 235 246    Liver Function Tests:  Recent Labs Lab 10/10/15 0600 10/13/15 0338  AST 27 84*  ALT 19 66*  ALKPHOS 63 61  BILITOT 1.7* 0.8  PROT 7.4 6.5  ALBUMIN 2.7* 2.4*    Coags:  Recent Labs Lab 10/10/15 1045  INR 1.40    Recent Labs Lab 10/10/15 1045  APTT 33    Recent Results (from the past 240 hour(s))  MRSA PCR Screening     Status: None   Collection Time: 10/09/15  4:56 AM  Result Value Ref Range Status   MRSA by PCR NEGATIVE NEGATIVE Final    Comment:        The GeneXpert MRSA Assay (FDA approved for NASAL specimens only), is one component of a comprehensive MRSA colonization surveillance program. It is not intended to diagnose MRSA infection nor to guide or monitor treatment for MRSA infections.   Culture, Urine     Status:  None   Collection Time: 10/10/15 10:12 AM  Result Value Ref Range Status   Specimen Description URINE, CATHETERIZED  Final   Special Requests NONE  Final   Culture NO GROWTH 1 DAY  Final   Report Status 10/11/2015 FINAL  Final  Culture, blood (routine x 2)     Status: None (Preliminary result)   Collection Time: 10/10/15 10:45  AM  Result Value Ref Range Status   Specimen Description BLOOD RIGHT ANTECUBITAL  Final   Special Requests BOTTLES DRAWN AEROBIC AND ANAEROBIC 5CC  Final   Culture NO GROWTH 3 DAYS  Final   Report Status PENDING  Incomplete  Culture, blood (routine x 2)     Status: None (Preliminary result)   Collection Time: 10/10/15 10:50 AM  Result Value Ref Range Status   Specimen Description BLOOD RIGHT HAND  Final   Special Requests IN PEDIATRIC BOTTLE 3CC  Final   Culture NO GROWTH 3 DAYS  Final   Report Status PENDING  Incomplete     Studies:   Recent x-ray studies have been reviewed in detail by the Attending Physician  Scheduled Meds:  Scheduled Meds: . amLODipine  5 mg Oral Daily  . antiseptic oral rinse  7 mL Mouth Rinse BID  . atorvastatin  40 mg Oral Daily  . budesonide  0.5 mg Nebulization BID  . clopidogrel  75 mg Oral Daily  . enoxaparin (LOVENOX) injection  40 mg Subcutaneous Q24H  . folic acid  1 mg Oral BID  . metoprolol succinate  50 mg Oral Daily  . piperacillin-tazobactam (ZOSYN)  IV  3.375 g Intravenous 3 times per day    Time spent on care of this patient:   Lonia Blood , MD   [facility] Office  [phone] Pager - Text Page per Loretha Stapler as per below:  On-Call/Text Page:      [email or web address]      password [email or web address]  If 7PM-7AM, please contact night-coverage [email or web address] Password [email or web address] 10/14/2015, 1:28 PM   LOS: 6 days

## 2015-10-14 NOTE — Care Management Important Message (Signed)
Important Message  Patient Details  Name: Kelly SimmerBetty Hellmer MRN: [id number] Date of Birth: [birth year]   Medicare Important Message Given:  Yes    Roxann Vierra P Jobie Popp 10/14/2015, 2:09 PM

## 2015-10-15 DIAGNOSIS — J189 Pneumonia, unspecified organism: Secondary | ICD-10-CM | POA: Diagnosis present

## 2015-10-15 DIAGNOSIS — A419 Sepsis, unspecified organism: Secondary | ICD-10-CM | POA: Diagnosis present

## 2015-10-15 LAB — COMPREHENSIVE METABOLIC PANEL
ALBUMIN: 2.6 g/dL — AB (ref 3.5–5.0)
ALK PHOS: 74 U/L (ref 38–126)
ALT: 54 U/L (ref 14–54)
ANION GAP: 8 (ref 5–15)
AST: 31 U/L (ref 15–41)
BILIRUBIN TOTAL: 1 mg/dL (ref 0.3–1.2)
BUN: 12 mg/dL (ref 6–20)
CALCIUM: 9 mg/dL (ref 8.9–10.3)
CO2: 28 mmol/L (ref 22–32)
CREATININE: 0.95 mg/dL (ref 0.44–1.00)
Chloride: 103 mmol/L (ref 101–111)
GFR calc Af Amer: >60 mL/min (ref >60)
GFR calc non Af Amer: 55 mL/min — ABNORMAL LOW (ref >60)
GLUCOSE: 113 mg/dL — AB (ref 65–99)
Potassium: 3.6 mmol/L (ref 3.5–5.1)
Sodium: 139 mmol/L (ref 135–145)
TOTAL PROTEIN: 6.7 g/dL (ref 6.5–8.1)

## 2015-10-15 LAB — CULTURE, BLOOD (ROUTINE X 2)
Culture: NO GROWTH
Culture: NO GROWTH

## 2015-10-15 LAB — CBC
HCT: 33.6 % — ABNORMAL LOW (ref 36.0–46.0)
HEMOGLOBIN: 10.9 g/dL — AB (ref 12.0–15.0)
MCH: 31 pg (ref 26.0–34.0)
MCHC: 32.4 g/dL (ref 30.0–36.0)
MCV: 95.5 fL (ref 78.0–100.0)
PLATELETS: 305 10*3/uL (ref 150–400)
RBC: 3.52 MIL/uL — ABNORMAL LOW (ref 3.87–5.11)
RDW: 13.9 % (ref 11.5–15.5)
WBC: 10.6 10*3/uL — ABNORMAL HIGH (ref 4.0–10.5)

## 2015-10-15 LAB — RESPIRATORY VIRUS PANEL
Adenovirus: NEGATIVE
INFLUENZA A: NEGATIVE
Influenza B: NEGATIVE
Metapneumovirus: NEGATIVE
PARAINFLUENZA 1 A: NEGATIVE
PARAINFLUENZA 2 A: NEGATIVE
PARAINFLUENZA 3 A: NEGATIVE
RESPIRATORY SYNCYTIAL VIRUS B: NEGATIVE
RHINOVIRUS: NEGATIVE
Respiratory Syncytial Virus A: NEGATIVE

## 2015-10-15 NOTE — Procedures (Signed)
Pt placed on home BiPAP machine for the evening.

## 2015-10-15 NOTE — Progress Notes (Signed)
Physical Therapy Treatment Patient Details Name: Kelly Garner MRN: [id number] DOB: December 21, 1932 Today's Date: 10/15/2015    History of Present Illness 81 year old female with a history of PVD, CHF, CAD with three-vessel CABG with report of cath in August of 2016, CVA 5 years ago who initially presented to [facility] ED with complaints of fever, chills and shortness of breath. Patient was status post a Fem-pop bypass on 10/04/2015 at [facility] by Dr. Keith Garner. Patient apparently was discharged home on 10/07/2015. Initial findings included a chest x-ray showing the possibility of CHF/COPD, a BNP of 1187, temperature of 101.96F, and the patient was found to be in A. fib with heart rates in the 150s. SIRS/sepsis, pneumonia;  Transferred to [facility]    PT Comments    Pt agreeable to mobility and demonstrates good awareness of safety.  Feel pt is ready for D/C to home from PT stand point.  Will continue to follow if remains on acute.    Follow Up Recommendations  Home health PT;Supervision/Assistance - 24 hour     Equipment Recommendations  Rolling walker with 5" wheels;3in1 (PT)    Recommendations for Other Services       Precautions / Restrictions Precautions Precautions: Fall Restrictions Weight Bearing Restrictions: No    Mobility  Bed Mobility               General bed mobility comments: pt sitting in recliner.    Transfers Overall transfer level: Needs assistance Equipment used: Rolling walker (2 wheeled) Transfers: Sit to/from Stand Sit to Stand: Supervision         General transfer comment: pt demonstrates good use of UEs and safe technique.    Ambulation/Gait Ambulation/Gait assistance: Supervision Ambulation Distance (Feet): 120 Feet Assistive device: Rolling walker (2 wheeled) Gait Pattern/deviations: Step-through pattern;Decreased stride length     General Gait Details: pt moves slowly and paces herself well.  No c/o SOB or pain in L LE.      Stairs            Wheelchair Mobility    Modified Rankin (Stroke Patients Only)       Balance Overall balance assessment: Needs assistance Sitting-balance support: No upper extremity supported;Feet supported Sitting balance-Leahy Scale: Good     Standing balance support: Single extremity supported;During functional activity Standing balance-Leahy Scale: Fair                      Cognition Arousal/Alertness: Awake/alert Behavior During Therapy: WFL for tasks assessed/performed Overall Cognitive Status: History of cognitive impairments - at baseline                      Exercises      General Comments        Pertinent Vitals/Pain Pain Assessment: No/denies pain    Home Living                      Prior Function            PT Goals (current goals can now be found in the care plan section) Acute Rehab PT Goals Patient Stated Goal: to go home  PT Goal Formulation: With patient Time For Goal Achievement: 10/27/15 Potential to Achieve Goals: Good Progress towards PT goals: Progressing toward goals    Frequency  Min 3X/week    PT Plan Current plan remains appropriate    Co-evaluation             End  of Session Equipment Utilized During Treatment: Gait belt Activity Tolerance: Patient tolerated treatment well Patient left: in chair;with call bell/phone within reach     Time: 1010-1031 PT Time Calculation (min) (ACUTE ONLY): 21 min  Charges:  $Gait Training: 8-22 mins                    G CodesSunny Garner:      Kelly Garner, [place] CarolinaPT [phone] 10/15/2015, 10:43 AM

## 2015-10-15 NOTE — Progress Notes (Signed)
[facility] TEAM 1 - Stepdown/ICU TEAM Progress Note  Kelly Garner XBJ:[id number] DOB: [birth year] DOA: 10/08/2015 PCP: No primary care provider on file.  Admit HPI / Brief Narrative: 81 yo WF PMHx PVD, CHF, CAD with three-vessel CABG with report of CATH in August of 2016, CVA 5 years ago   [facility] with complaints of fever, chills and shortness of breath. Patient was status post a [facility] Dr. Keith RakeJames Drougas. Patient apparently was discharged home on 10/07/2015. Initial findings included a chest x-ray showing the possibility of CHF/COPD, a BNP of 1187, temperature of 101.62F, and the patient was found to be in A. fib with heart rates in the 150s. She was started on a diltiazem drip along with Vanco and Zosyn empirically. [facility].   HPI/Subjective: 12/13 A/O 4 sitting in chair comfortably,follows most commands,  states dry weight 196 pounds (88.9 kg )   Assessment/Plan: Sepsis unspecified organism/possible Lt basilar HCAP -Patient has criteria for SIRS + AMS which meets criteria for sepsis  -Completed 5 day course of Zosyn  Paroxysmal Atrial fibrillation with acute RVR  -TSH normal  - previously on coumadin (stopped due to ICH) -Amlodipine 5 mg daily -Toprol-XL 50 mg daily   Dilated cardiomyopathy -Quantification not available -Strict in and out - 3.9 ml -Daily a.m. Weight; admission weight bed = 92.8 kg      12/13 standing weight= 85 kg -Currently slightly below reported dry weight   Peripheral vascular disease  -DC Cilostazol  as it is contraindicated in CHF patients  Coronary artery disease status post three-vessel CABG -there is note of a recent cath in August 2016 - records are not available at this time  Status post femoral-popliteal bypass surgery -monitor site for any signs of infection   Hypokalemia  -Potassium goal> 4  Hypomagnesemia -Magnesium goal> 2  Acute renal failure   -Resolved    Normocytic anemia  -Goal Hgb should be 8.0 - Hgb was 8.8 on 12/4 per Care Everywhere, but preop was as high as 10.5    Hx of hemorrhagic CVA in 2009  Altered mental status -Appears most consistent with sundowning- daughter notes pt has very poor short term memory at baseline - appears stable presently  -Please do not use Ambien or Ativan in this 81 year old septic patient.   Agitation -Haldol 2 mg QID PRN     Code Status: FULL Family Communication: No family available  Disposition Plan:  Discharge in a.m.   Consultants: NA  Procedure/Significant Events: 12/7 echocardiogram;- Left ventricle: mildly dilated. -LVEF= 50% to 55%.  - Left atrium: moderately dilated. - Inferior vena cava: The vessel was dilated. The respirophasic  diameter changes were blunted (< 50%), consistent with elevated CVP.   Culture 12/8 blood culture pending 12/8 urine culture pending 12/8 respiratory panel 12/8 influenza panel   Antibiotics: Zosyn 12/8>> 12/12 Vancomycin 12/8>> 12/9   DVT prophylaxis: NA   Devices NA   LINES / TUBES:  NA    Continuous Infusions:    Objective: VITAL SIGNS: Temp: 98.3 F (36.8 C) (12/13 1215) Temp Source: Oral (12/13 1215) BP: 175/67 mmHg (12/13 1215) Pulse Rate: 82 (12/13 1215) SPO2; FIO2:   Intake/Output Summary (Last 24 hours) at 10/15/15 1824 Last data filed at 10/15/15 1600  Gross per 24 hour  Intake   1090 ml  Output   1150 ml  Net    -60 ml     Exam: General:A/O 4,  follows most commands, negative Acute respiratory distress  Eyes: Negative headache, eye pain, negative scleral hemorrhage ENT: Negative Runny nose, negative gingival bleeding, Neck:  Negative scars, masses, torticollis, lymphadenopathy, JVD Lungs:  there to auscultation bilateral Cardiovascular: Irregular irregular rhythm and rate, without murmur gallop or rub normal S1 and S2 Abdomen:negative abdominal pain, nondistended, positive soft, bowel  sounds, no rebound, no ascites, no appreciable mass Extremities: No significant cyanosis, clubbing, or edema bilateral lower extremities Psychiatric:  Negative depression, negative anxiety, negative fatigue, negative mania  Neurologic:  Cranial nerves II through XII intact, tongue/uvula midline, all extremities muscle strength 5/5, sensation intact throughout, negative dysarthria, negative expressive aphasia, negative receptive aphasia.   Data Reviewed: Basic Metabolic Panel:  Recent Labs Lab 10/09/15 1645  10/10/15 0930 10/11/15 0512 10/12/15 0523 10/13/15 0338 10/14/15 0526 10/15/15 0442  NA  --   < >  --  138 140 141 139 139  K  --   < >  --  3.6 3.7 3.1* 3.5 3.6  CL  --   < >  --  104 104 107 103 103  CO2  --   < >  --  GLUCOSE  --   < >  --  175* 160* 146* 107* 113*  BUN  --   < >  --  30* 36* 26* 15 12  CREATININE  --   < >  --  1.28* 1.15* 1.04* 0.92 0.95  CALCIUM  --   < >  --  9.0 9.2 8.8* 9.0 9.0  MG 1.4*  --  1.7 2.0  --   --   --   --   < > = values in this interval not displayed. Liver Function Tests:  Recent Labs Lab 10/10/15 0600 10/13/15 0338 10/15/15 0442  AST 27 84* 31  ALT 19 66* 54  ALKPHOS 63 61 74  BILITOT 1.7* 0.8 1.0  PROT 7.4 6.5 6.7  ALBUMIN 2.7* 2.4* 2.6*   No results for input(s): LIPASE, AMYLASE in the last 168 hours. No results for input(s): AMMONIA in the last 168 hours. CBC:  Recent Labs Lab 10/09/15 0545  10/10/15 0600 10/11/15 0512 10/12/15 0523 10/13/15 0338 10/15/15 0442  WBC 8.6  < > 14.4* 11.2* 11.7* 8.3 10.6*  NEUTROABS 5.0  --   --  9.2*  --   --   --   HGB 7.8*  < > 10.8* 9.1* 9.9* 9.8* 10.9*  HCT 23.7*  < > 32.2* 26.4* 29.0* 29.2* 33.6*  MCV 95.6  < > 94.4 94.6 95.4 96.1 95.5  PLT 150  < > 194 186 235 246 305  < > = values in this interval not displayed. Cardiac Enzymes: No results for input(s): CKTOTAL, CKMB, CKMBINDEX, TROPONINI in the last 168 hours. BNP (last 3 results) No results for  input(s): BNP in the last 8760 hours.  ProBNP (last 3 results) No results for input(s): PROBNP in the last 8760 hours.  CBG: No results for input(s): GLUCAP in the last 168 hours.  Recent Results (from the past 240 hour(s))  MRSA PCR Screening     Status: None   Collection Time: 10/09/15  4:56 AM  Result Value Ref Range Status   MRSA by PCR NEGATIVE NEGATIVE Final    Comment:        The GeneXpert MRSA Assay (FDA approved for NASAL specimens only), is one component of a comprehensive MRSA colonization surveillance program. It is not intended to  diagnose MRSA infection nor to guide or monitor treatment for MRSA infections.   Culture, Urine     Status: None   Collection Time: 10/10/15 10:12 AM  Result Value Ref Range Status   Specimen Description URINE, CATHETERIZED  Final   Special Requests NONE  Final   Culture NO GROWTH 1 DAY  Final   Report Status 10/11/2015 FINAL  Final  Respiratory virus panel     Status: None   Collection Time: 10/10/15 10:17 AM  Result Value Ref Range Status   Source - RVPAN NASAL SWAB  Corrected   Respiratory Syncytial Virus A Negative Negative Final   Respiratory Syncytial Virus B Negative Negative Final   Influenza A Negative Negative Final   Influenza B Negative Negative Final   Parainfluenza 1 Negative Negative Final   Parainfluenza 2 Negative Negative Final   Parainfluenza 3 Negative Negative Final   Metapneumovirus Negative Negative Final   Rhinovirus Negative Negative Final   Adenovirus Negative Negative Final    Comment: (NOTE) Performed At: [facility] [address] [place], [place] [id number] Mila Homer MD WU:[id number]   Culture, blood (routine x 2)     Status: None   Collection Time: 10/10/15 10:45 AM  Result Value Ref Range Status   Specimen Description BLOOD RIGHT ANTECUBITAL  Final   Special Requests BOTTLES DRAWN AEROBIC AND ANAEROBIC 5CC  Final   Culture NO GROWTH 5 DAYS  Final   Report Status 10/15/2015  FINAL  Final  Culture, blood (routine x 2)     Status: None   Collection Time: 10/10/15 10:50 AM  Result Value Ref Range Status   Specimen Description BLOOD RIGHT HAND  Final   Special Requests IN PEDIATRIC BOTTLE 3CC  Final   Culture NO GROWTH 5 DAYS  Final   Report Status 10/15/2015 FINAL  Final     Studies:  Recent x-ray studies have been reviewed in detail by the Attending Physician  Scheduled Meds:  Scheduled Meds: . amLODipine  5 mg Oral Daily  . antiseptic oral rinse  7 mL Mouth Rinse BID  . atorvastatin  40 mg Oral Daily  . budesonide  0.5 mg Nebulization BID  . clopidogrel  75 mg Oral Daily  . enoxaparin (LOVENOX) injection  40 mg Subcutaneous Q24H  . folic acid  1 mg Oral BID  . metoprolol succinate  50 mg Oral Daily    Time spent on care of this patient: 40 mins   Sherlyn Ebbert, Roselind Messier , MD  [facility] Office  [phone] Pager - [phone]  On-Call/Text Page:      [email or web address]      password [email or web address]  If 7PM-7AM, please contact night-coverage [email or web address] Password [email or web address] 10/15/2015, 6:24 PM   LOS: 7 days   Care during the described time interval was provided by me .  I have reviewed this patient's available data, including medical history, events of note, physical examination, and all test results as part of my evaluation. I have personally reviewed and interpreted all radiology studies.   Carolyne Littles, MD [phone] Pager

## 2015-10-15 NOTE — Progress Notes (Signed)
Talked to daughter Therisa DoyneCindy Martin ( [phone]( [phone]) [facility]; Patient stated that she does not need any DME at this time, she has a rollator at home ( rolling walker with seat). Attending MD please enter the face to face in Epic [facility] per Medicare guidelines. Abelino DerrickB Zaiya Annunziato [facility]RN,MHA,BSN [phone]

## 2015-10-15 NOTE — Progress Notes (Signed)
RT assisted patient with home BIPAP.

## 2015-10-16 DIAGNOSIS — I5023 Acute on chronic systolic (congestive) heart failure: Secondary | ICD-10-CM

## 2015-10-16 DIAGNOSIS — J189 Pneumonia, unspecified organism: Secondary | ICD-10-CM

## 2015-10-16 MED ORDER — METOPROLOL SUCCINATE ER 25 MG PO TB24: 25.0000 mg | ORAL_TABLET | Freq: Every day | ORAL | Status: DC

## 2015-10-16 MED ORDER — IPRATROPIUM-ALBUTEROL 0.5-2.5 (3) MG/3ML IN SOLN: 3.0000 mL | Freq: Four times a day (QID) | RESPIRATORY_TRACT | Status: AC | PRN

## 2015-10-16 MED ORDER — FUROSEMIDE 20 MG PO TABS: 20.0000 mg | ORAL_TABLET | ORAL | Status: DC | PRN

## 2015-10-16 MED ORDER — IRBESARTAN 300 MG PO TABS
150.0000 mg | ORAL_TABLET | Freq: Every day | ORAL | Status: DC
  Administered 2015-10-16: 150 mg via ORAL
  Filled 2015-10-16: qty 1

## 2015-10-16 MED ORDER — AMLODIPINE BESYLATE 10 MG PO TABS: 10.0000 mg | ORAL_TABLET | Freq: Every day | ORAL | Status: DC

## 2015-10-16 MED ORDER — AMLODIPINE BESYLATE 10 MG PO TABS: 10.0000 mg | ORAL_TABLET | Freq: Every day | ORAL | Status: AC

## 2015-10-16 MED ORDER — POTASSIUM CHLORIDE CRYS ER 20 MEQ PO TBCR
20.0000 meq | EXTENDED_RELEASE_TABLET | Freq: Every day | ORAL | Status: DC
  Administered 2015-10-16: 20 meq via ORAL
  Filled 2015-10-16: qty 1

## 2015-10-16 MED ORDER — METOPROLOL SUCCINATE ER 50 MG PO TB24: 50.0000 mg | ORAL_TABLET | Freq: Every day | ORAL | Status: DC

## 2015-10-16 MED ORDER — ACETAMINOPHEN 500 MG PO TABS: 500.0000 mg | ORAL_TABLET | Freq: Four times a day (QID) | ORAL | Status: DC | PRN

## 2015-10-16 MED ORDER — POTASSIUM CHLORIDE CRYS ER 20 MEQ PO TBCR: 20.0000 meq | EXTENDED_RELEASE_TABLET | Freq: Every day | ORAL | Status: AC | PRN

## 2015-10-16 NOTE — Discharge Instructions (Signed)
Check your weight when you get home - the first value will be your baseline weight.  Then check your weight each day.  If you gain over 2 pounds between weight measurements, begin taking your lasix and potassium pill daily.  Once your weight drops back to your baseline weight, stop taking the lasix and potassium pills.    Heart Failure  Heart failure is a condition in which the heart has trouble pumping blood. This means your heart does not pump blood efficiently for your body to work well. In some cases of heart failure, fluid may back up into your lungs or you may have swelling (edema) in your lower legs. Heart failure is usually a long-term (chronic) condition. It is important for you to take good care of yourself and follow your health care provider's treatment plan. CAUSES  Some health conditions can cause heart failure. Those health conditions include:  High blood pressure (hypertension). Hypertension causes the heart muscle to work harder than normal. When pressure in the blood vessels is high, the heart needs to pump (contract) with more force in order to circulate blood throughout the body. High blood pressure eventually causes the heart to become stiff and weak.  Coronary artery disease (CAD). CAD is the buildup of cholesterol and fat (plaque) in the arteries of the heart. The blockage in the arteries deprives the heart muscle of oxygen and blood. This can cause chest pain and may lead to a heart attack. High blood pressure can also contribute to CAD.  Heart attack (myocardial infarction). A heart attack occurs when one or more arteries in the heart become blocked. The loss of oxygen damages the muscle tissue of the heart. When this happens, part of the heart muscle dies. The injured tissue does not contract as well and weakens the heart's ability to pump blood.  Abnormal heart valves. When the heart valves do not open and close properly, it can cause heart failure. This makes the heart  muscle pump harder to keep the blood flowing.  Heart muscle disease (cardiomyopathy or myocarditis). Heart muscle disease is damage to the heart muscle from a variety of causes. These can include drug or alcohol abuse, infections, or unknown reasons. These can increase the risk of heart failure.  Lung disease. Lung disease makes the heart work harder because the lungs do not work properly. This can cause a strain on the heart, leading it to fail.  Diabetes. Diabetes increases the risk of heart failure. High blood sugar contributes to high fat (lipid) levels in the blood. Diabetes can also cause slow damage to tiny blood vessels that carry important nutrients to the heart muscle. When the heart does not get enough oxygen and food, it can cause the heart to become weak and stiff. This leads to a heart that does not contract efficiently.  Other conditions can contribute to heart failure. These include abnormal heart rhythms, thyroid problems, and low blood counts (anemia). Certain unhealthy behaviors can increase the risk of heart failure, including:  Being overweight.  Smoking or chewing tobacco.  Eating foods high in fat and cholesterol.  Abusing illicit drugs or alcohol.  Lacking physical activity. SYMPTOMS  Heart failure symptoms may vary and can be hard to detect. Symptoms may include:  Shortness of breath with activity, such as climbing stairs.  Persistent cough.  Swelling of the feet, ankles, legs, or abdomen.  Unexplained weight gain.  Difficulty breathing when lying flat (orthopnea).  Waking from sleep because of the need to  sit up and get more air.  Rapid heartbeat.  Fatigue and loss of energy.  Feeling light-headed, dizzy, or close to fainting.  Loss of appetite.  Nausea.  Increased urination during the night (nocturia). DIAGNOSIS  A diagnosis of heart failure is based on your history, symptoms, physical examination, and diagnostic tests. Diagnostic tests for  heart failure may include:  Echocardiography.  Electrocardiography.  Chest X-ray.  Blood tests.  Exercise stress test.  Cardiac angiography.  Radionuclide scans. TREATMENT  Treatment is aimed at managing the symptoms of heart failure. Medicines, behavioral changes, or surgical intervention may be necessary to treat heart failure.  Medicines to help treat heart failure may include:  Angiotensin-converting enzyme (ACE) inhibitors. This type of medicine blocks the effects of a blood protein called angiotensin-converting enzyme. ACE inhibitors relax (dilate) the blood vessels and help lower blood pressure.  Angiotensin receptor blockers (ARBs). This type of medicine blocks the actions of a blood protein called angiotensin. Angiotensin receptor blockers dilate the blood vessels and help lower blood pressure.  Water pills (diuretics). Diuretics cause the kidneys to remove salt and water from the blood. The extra fluid is removed through urination. This loss of extra fluid lowers the volume of blood the heart pumps.  Beta blockers. These prevent the heart from beating too fast and improve heart muscle strength.  Digitalis. This increases the force of the heartbeat.  Healthy behavior changes include:  Obtaining and maintaining a healthy weight.  Stopping smoking or chewing tobacco.  Eating heart-healthy foods.  Limiting or avoiding alcohol.  Stopping illicit drug use.  Physical activity as directed by your health care provider.  Surgical treatment for heart failure may include:  A procedure to open blocked arteries, repair damaged heart valves, or remove damaged heart muscle tissue.  A pacemaker to improve heart muscle function and control certain abnormal heart rhythms.  An internal cardioverter defibrillator to treat certain serious abnormal heart rhythms.  A left ventricular assist device (LVAD) to assist the pumping ability of the heart. HOME CARE INSTRUCTIONS    Take medicines only as directed by your health care provider. Medicines are important in reducing the workload of your heart, slowing the progression of heart failure, and improving your symptoms.  Do not stop taking your medicine unless directed by your health care provider.  Do not skip any dose of medicine.  Refill your prescriptions before you run out of medicine. Your medicines are needed every day.  Engage in moderate physical activity if directed by your health care provider. Moderate physical activity can benefit some people. The elderly and people with severe heart failure should consult with a health care provider for physical activity recommendations.  Eat heart-healthy foods. Food choices should be free of trans fat and low in saturated fat, cholesterol, and salt (sodium). Healthy choices include fresh or frozen fruits and vegetables, fish, lean meats, legumes, fat-free or low-fat dairy products, and whole grain or high fiber foods. Talk to a dietitian to learn more about heart-healthy foods.  Limit sodium if directed by your health care provider. Sodium restriction may reduce symptoms of heart failure in some people. Talk to a dietitian to learn more about heart-healthy seasonings.  Use healthy cooking methods. Healthy cooking methods include roasting, grilling, broiling, baking, poaching, steaming, or stir-frying. Talk to a dietitian to learn more about healthy cooking methods.  Limit fluids if directed by your health care provider. Fluid restriction may reduce symptoms of heart failure in some people.  Weigh yourself every day.  Daily weights are important in the early recognition of excess fluid. You should weigh yourself every morning after you urinate and before you eat breakfast. Wear the same amount of clothing each time you weigh yourself. Record your daily weight. Provide your health care provider with your weight record.  Monitor and record your blood pressure if  directed by your health care provider.  Check your pulse if directed by your health care provider.  Lose weight if directed by your health care provider. Weight loss may reduce symptoms of heart failure in some people.  Stop smoking or chewing tobacco. Nicotine makes your heart work harder by causing your blood vessels to constrict. Do not use nicotine gum or patches before talking to your health care provider.  Keep all follow-up visits as directed by your health care provider. This is important.  Limit alcohol intake to no more than 1 drink per day for nonpregnant women and 2 drinks per day for men. One drink equals 12 ounces of beer, 5 ounces of wine, or 1 ounces of hard liquor. Drinking more than that is harmful to your heart. Tell your health care provider if you drink alcohol several times a week. Talk with your health care provider about whether alcohol is safe for you. If your heart has already been damaged by alcohol or you have severe heart failure, drinking alcohol should be stopped completely.  Stop illicit drug use.  Stay up-to-date with immunizations. It is especially important to prevent respiratory infections through current pneumococcal and influenza immunizations.  Manage other health conditions such as hypertension, diabetes, thyroid disease, or abnormal heart rhythms as directed by your health care provider.  Learn to manage stress.  Plan rest periods when fatigued.  Learn strategies to manage high temperatures. If the weather is extremely hot:  Avoid vigorous physical activity.  Use air conditioning or fans or seek a cooler location.  Avoid caffeine and alcohol.  Wear loose-fitting, lightweight, and light-colored clothing.  Learn strategies to manage cold temperatures. If the weather is extremely cold:  Avoid vigorous physical activity.  Layer clothes.  Wear mittens or gloves, a hat, and a scarf when going outside.  Avoid alcohol.  Obtain ongoing  education and support as needed.  Participate in or seek rehabilitation as needed to maintain or improve independence and quality of life. SEEK MEDICAL CARE IF:   You have a rapid weight gain.  You have increasing shortness of breath that is unusual for you.  You are unable to participate in your usual physical activities.  You tire easily.  You cough more than normal, especially with physical activity.  You have any or more swelling in areas such as your hands, feet, ankles, or abdomen.  You are unable to sleep because it is hard to breathe.  You feel like your heart is beating fast (palpitations).  You become dizzy or light-headed upon standing up. SEEK IMMEDIATE MEDICAL CARE IF:   You have difficulty breathing.  There is a change in mental status such as decreased alertness or difficulty with concentration.  You have a pain or discomfort in your chest.  You have an episode of fainting (syncope). MAKE SURE YOU:   Understand these instructions.  Will watch your condition.  Will get help right away if you are not doing well or get worse.   This information is not intended to replace advice given to you by your health care provider. Make sure you discuss any questions you have with your health care  provider.   Document Released: 10/19/2005 Document Revised: 03/05/2015 Document Reviewed: 11/18/2012 Elsevier Interactive Patient Education [employer].

## 2015-10-16 NOTE — Discharge Summary (Signed)
DISCHARGE SUMMARY  Annalis Kaczmarczyk  MR#: [id number]  DOB:12-02-1932  Date of Admission: 10/08/2015 Date of Discharge: 10/16/2015  Attending Physician:Kassia Demarinis T  Patient's PCP:No primary care provider on file.  Consults:  none  Disposition: D/C home w/ daughter and [facility] services   Follow-up Appts:     Follow-up Information    Follow up with [facility].   Why:  They will continue to do your home health care at your home   Contact information:   [address] [place] [address] [phone]       Follow up with Your home Primary Care Doctor In 1 week.     Tests Needing Follow-up: -f/u of weight/fluid balance -f/u of renal fxn and K+ -f/u of BP control and HR control   Discharge Diagnoses: SIRS v/s Sepsis unspecified organism - possible L basilar HCAP  Hypotension  Hx of Parox Atrial fibrillation with acute RVR  Status post femoral-popliteal bypass surgery Mild chronic systolic CHF w/ acute exacerbation  Wheezing/SOB Leukocytosis Hypokalemia  Acute renal failure  Altered mental status Normocytic anemia  Peripheral vascular disease  Coronary artery disease status post three-vessel CABG Mild transaminitis  Hx of hemorrhagic CVA in 2009  Initial presentation: 81 year old female with a history of PVD, CHF, CAD with three-vessel CABG with report of cath in August of 2016, CVA 5 years ago who initially presented to [facility] ED with complaints of fever, chills and shortness of breath. Patient was status post a Fem-pop bypass on 10/04/2015 at [facility] by Dr. Keith Rake. Patient apparently was discharged home on 10/07/2015. Initial findings included a chest x-ray showing the possibility of CHF/COPD, a BNP of 1187, temperature of 101.56F, and the patient was found to be in A. fib with heart rates in the 150s. She was started on a diltiazem drip along with Vanco and Zosyn empirically. She was transferred to [facility].  Hospital  Course:  SIRS v/s Sepsis unspecified organism - possible L basilar HCAP  temp 101.56F, HR 130, WBC 12.5 initially at outside facility - empiric antibiotics started 12/8 - Left basilar infiltrate more evident on f/u chest x-ray - treating as a L basilar HCAP - completed 5 days of zosyn 12/12  Hypotension  Resolved  Hx of Parox Atrial fibrillation with acute RVR  TSH normal - previously on coumadin (stopped due to ICH) - rate well controlled at time of d/c   Status post femoral-popliteal bypass surgery monitored site for any signs of infection - wounds intact w/o any sign of infection - pt instructed to keep her scheduled f/u w/ her Vascular Surgeon   Mild chronic systolic CHF w/ acute exacerbation  TTE this admit notes EF 50-55% w/ normal WMA but unable to comment on diastolic fxn - net negative ~4L since admit - no clinical evidence of volume overload at time of d/c - discussed w/ daughter and will plan to return to prior home tx plan wherein the pt weighs daily and takes lasix 20mg  QD any time her weight increases >2 pounds, stopping when her weight returns to baseline   Wheezing/SOB Resolved at this time - noted at time of blood transfusion   Leukocytosis Resolved then recurred but patient dosed with steroids - WBC normalized again prior to d/c   Hypokalemia  Replaced - due to diuretic in face of poor oral intake - to utilize prn Kdur any time prn Lasix used at home   Acute renal failure  crt was normal at 0.87 12/4 per  Care Everywhere - crt normalized prior to d/c home    Altered mental status most consistent with sundowning - avoided benzodiazepines & Ambien - daughter notes pt has very poor short term memory at baseline - appears stable at time of d/c   Normocytic anemia  Goal Hgb should be 8.0 - Hgb was 8.8 on 12/4 per Care Everywhere, but preop was as high as 10.5 - stable/climbing at time of d/c    Peripheral vascular disease  -continue home meds - wounds stable -  Pletal stopped as it is contraindicated in CHF of any degree  Coronary artery disease status post three-vessel CABG there is note of a recent cath in August 2016 - records are not available at this time - no chest pain   Mild transaminitis  Unclear etiology - resolved w/ lowered dose of APAP  Hx of hemorrhagic CVA in 2009    Medication List    STOP taking these medications        cilostazol 100 MG tablet  Commonly known as:  PLETAL     gabapentin 300 MG capsule  Commonly known as:  NEURONTIN      TAKE these medications        acetaminophen 500 MG tablet  Commonly known as:  TYLENOL  Take 1 tablet (500 mg total) by mouth every 6 (six) hours as needed for mild pain or headache (DO NOT TAKE MORE THAN  of Tylenol at a time as it can damage your liver.).     albuterol 108 (90 BASE) MCG/ACT inhaler  Commonly known as:  PROVENTIL HFA;VENTOLIN HFA  Inhale 2 puffs into the lungs every 6 (six) hours as needed for wheezing or shortness of breath.     amLODipine 10 MG tablet  Commonly known as:  NORVASC  Take 1 tablet (10 mg total) by mouth daily.  Start taking on:  10/17/2015     atorvastatin 40 MG tablet  Commonly known as:  LIPITOR  Take 40 mg by mouth daily.     budesonide 0.5 MG/2ML nebulizer solution  Commonly known as:  PULMICORT  Take 0.5 mg by nebulization 2 (two) times daily.     CALTRATE 600+D PO  Take 1 tablet by mouth 2 (two) times daily.     cetirizine 10 MG tablet  Commonly known as:  ZYRTEC  Take 10 mg by mouth daily.     clopidogrel 75 MG tablet  Commonly known as:  PLAVIX  Take 75 mg by mouth daily.     diclofenac sodium 1 % Gel  Commonly known as:  VOLTAREN  Apply 2 g topically 4 (four) times daily. knee     folic acid 1 MG tablet  Commonly known as:  FOLVITE  Take 1 mg by mouth 2 (two) times daily.     furosemide 20 MG tablet  Commonly known as:  LASIX  Take 20 mg by mouth as needed for fluid.     ipratropium-albuterol 0.5-2.5 (3)  MG/3ML Soln  Commonly known as:  DUONEB  Take 3 mLs by nebulization every 6 (six) hours as needed.     metoprolol succinate 50 MG 24 hr tablet  Commonly known as:  TOPROL-XL  Take 1 tablet (50 mg total) by mouth daily. Take with or immediately following a meal.     omega-3 acid ethyl esters 1 G capsule  Commonly known as:  LOVAZA  Take 1 g by mouth 2 (two) times daily.     potassium chloride SA 20  MEQ tablet  Commonly known as:  K-DUR,KLOR-CON  Take 1 tablet (20 mEq total) by mouth daily as needed (take any day you have to take a lasix (furosemide) dose).     probenecid 500 MG tablet  Commonly known as:  BENEMID  Take 500 mg by mouth 2 (two) times daily.     telmisartan 80 MG tablet  Commonly known as:  MICARDIS  Take 80 mg by mouth daily.     Vitamin D2 2000 UNITS Tabs  Take 2,000 Units by mouth every evening.     Vitamin D3 5000 UNITS Caps  Take 5,000 Units by mouth every morning.        Day of Discharge BP 176/75 mmHg  Pulse 80  Temp(Src) 98 F (36.7 C) (Oral)  Resp 16  Ht  (1.6 m)  Wt 83.779 kg (184 lb 11.2 oz)  BMI 32.73 kg/m2  SpO2 96%  Physical Exam: General: No acute respiratory distress - alert and comfortable  Lungs: Clear to auscultation bilaterally without wheezes or crackles Cardiovascular: Regular rate without murmur gallop or rub normal S1 and S2 Abdomen: Nontender, nondistended, soft, bowel sounds positive, no rebound, no ascites, no appreciable mass Extremities: No significant cyanosis, clubbing, or edema bilateral lower extremities  Basic Metabolic Panel:  Recent Labs Lab 10/09/15 1645  10/10/15 0930 10/11/15 0512 10/12/15 0523 10/13/15 0338 10/14/15 0526 10/15/15 0442  NA  --   < >  --  138 140 141 139 139  K  --   < >  --  3.6 3.7 3.1* 3.5 3.6  CL  --   < >  --  104 104 107 103 103  CO2  --   < >  --  GLUCOSE  --   < >  --  175* 160* 146* 107* 113*  BUN  --   < >  --  30* 36* 26* 15 12  CREATININE  --   < >   --  1.28* 1.15* 1.04* 0.92 0.95  CALCIUM  --   < >  --  9.0 9.2 8.8* 9.0 9.0  MG 1.4*  --  1.7 2.0  --   --   --   --   < > = values in this interval not displayed.  Liver Function Tests:  Recent Labs Lab 10/10/15 0600 10/13/15 0338 10/15/15 0442  AST 27 84* 31  ALT 19 66* 54  ALKPHOS 63 61 74  BILITOT 1.7* 0.8 1.0  PROT 7.4 6.5 6.7  ALBUMIN 2.7* 2.4* 2.6*   Coags:  Recent Labs Lab 10/10/15 1045  INR 1.40   CBC:  Recent Labs Lab 10/10/15 0600 10/11/15 0512 10/12/15 0523 10/13/15 0338 10/15/15 0442  WBC 14.4* 11.2* 11.7* 8.3 10.6*  NEUTROABS  --  9.2*  --   --   --   HGB 10.8* 9.1* 9.9* 9.8* 10.9*  HCT 32.2* 26.4* 29.0* 29.2* 33.6*  MCV 94.4 94.6 95.4 96.1 95.5  PLT 194 186 235 246 305    Recent Results (from the past 240 hour(s))  MRSA PCR Screening     Status: None   Collection Time: 10/09/15  4:56 AM  Result Value Ref Range Status   MRSA by PCR NEGATIVE NEGATIVE Final    Comment:        The GeneXpert MRSA Assay (FDA approved for NASAL specimens only), is one component of a comprehensive MRSA colonization surveillance program. It is not intended to diagnose MRSA  infection nor to guide or monitor treatment for MRSA infections.   Culture, Urine     Status: None   Collection Time: 10/10/15 10:12 AM  Result Value Ref Range Status   Specimen Description URINE, CATHETERIZED  Final   Special Requests NONE  Final   Culture NO GROWTH 1 DAY  Final   Report Status 10/11/2015 FINAL  Final  Respiratory virus panel     Status: None   Collection Time: 10/10/15 10:17 AM  Result Value Ref Range Status   Source - RVPAN NASAL SWAB  Corrected   Respiratory Syncytial Virus A Negative Negative Final   Respiratory Syncytial Virus B Negative Negative Final   Influenza A Negative Negative Final   Influenza B Negative Negative Final   Parainfluenza 1 Negative Negative Final   Parainfluenza 2 Negative Negative Final   Parainfluenza 3 Negative Negative Final    Metapneumovirus Negative Negative Final   Rhinovirus Negative Negative Final   Adenovirus Negative Negative Final    Comment: (NOTE) Performed At: [facility] [address] [place], [place] [id number] Mila HomerHancock William F MD WU:[id number]:[phone]   Culture, blood (routine x 2)     Status: None   Collection Time: 10/10/15 10:45 AM  Result Value Ref Range Status   Specimen Description BLOOD RIGHT ANTECUBITAL  Final   Special Requests BOTTLES DRAWN AEROBIC AND ANAEROBIC 5CC  Final   Culture NO GROWTH 5 DAYS  Final   Report Status 10/15/2015 FINAL  Final  Culture, blood (routine x 2)     Status: None   Collection Time: 10/10/15 10:50 AM  Result Value Ref Range Status   Specimen Description BLOOD RIGHT HAND  Final   Special Requests IN PEDIATRIC BOTTLE 3CC  Final   Culture NO GROWTH 5 DAYS  Final   Report Status 10/15/2015 FINAL  Final    Time spent in discharge (includes decision making & examination of pt): >35 minutes  10/16/2015, 12:16 PM   Lonia BloodJeffrey T. Jaxden Blyden, MD [facility] Office  [phone] Pager [phone]  On-Call/Text Page:      [email or web address]      password [email or web address]

## 2015-10-16 NOTE — Progress Notes (Signed)
Discharge instructions reviewed with daughter. RXs given. All questions answered at this time. PICC removed by IV team. Will be ready at 1400 for discharge home. Transport by family.  Sim BoastHavy, RN

## 2015-10-16 NOTE — Progress Notes (Signed)
RN explained to patient and family that the foley catheter would need to be D/C'ed and pt would need to void before discharge. Pt's daughter says she will not be able to provide a ride home for the patient until noon today. Foley to be D/C'ed around 4 AM so the patient can rest tonight, but still have 8 hours to void. Will continue to monitor.

## 2015-10-16 NOTE — Progress Notes (Signed)
Occupational Therapy Treatment Patient Details Name: Kelly SimmerBetty Garner MRN: [id number] DOB: Sep 13, 1933 Today's Date: 10/16/2015    History of present illness 81 year old female with a history of PVD, CHF, CAD with three-vessel CABG with report of cath in August of 2016, CVA 5 years ago who [facility] with complaints of fever, chills and shortness of breath. Patient was status post a Fem-pop [facility] by Dr. Keith RakeJames Drougas. Patient apparently was discharged home on 10/07/2015. Initial findings included a chest x-ray showing the possibility of CHF/COPD, a BNP of 1187, temperature of 101.81F, and the patient was found to be in A. fib with heart rates in the 150s. SIRS/sepsis, pneumonia;  [facility]   OT comments  Pt progressing toward goals.  She currently, requires supervision for ADLs.    Follow Up Recommendations  No OT follow up;Supervision/Assistance - 24 hour    Equipment Recommendations  None recommended by OT    Recommendations for Other Services      Precautions / Restrictions Precautions Precautions: Fall       Mobility Bed Mobility                  Transfers Overall transfer level: Needs assistance Equipment used: Rolling walker (2 wheeled) Transfers: Sit to/from [employer]Stand;Stand Pivot Transfers Sit to Stand: Supervision              Balance Overall balance assessment: Needs assistance Sitting-balance support: No upper extremity supported Sitting balance-Leahy Scale: Good     Standing balance support: During functional activity Standing balance-Leahy Scale: Fair                     ADL       Grooming: Wash/dry hands;Wash/dry face;Oral care;Brushing hair;Supervision/safety;Standing                   Toilet Transfer: Supervision/safety;Ambulation;Comfort height toilet;RW;Grab bars   Toileting- Clothing Manipulation and Hygiene: Sit to/from stand;Min guard       Functional  mobility during ADLs: Rolling walker;Supervision/safety        Vision                     Perception     Praxis      Cognition   Behavior During Therapy: WFL for tasks assessed/performed Overall Cognitive Status: History of cognitive impairments - at baseline       Memory: Decreased short-term memory               Extremity/Trunk Assessment               Exercises     Shoulder Instructions       General Comments      Pertinent Vitals/ Pain       Pain Assessment: No/denies pain  Home Living                                          Prior Functioning/Environment              Frequency Min 2X/week     Progress Toward Goals  OT Goals(current goals can now be found in the care plan section)     ADL Goals Pt Will Perform Grooming: with supervision;standing Pt Will Perform Upper Body Bathing: with set-up;sitting Pt Will Perform Lower Body Bathing: with supervision;sit to/from stand Pt Will  Perform Upper Body Dressing: with supervision;sitting Pt Will Perform Lower Body Dressing: with supervision;sit to/from stand Pt Will Transfer to Toilet: with supervision;ambulating;regular height toilet;bedside commode;grab bars Pt Will Perform Toileting - Clothing Manipulation and hygiene: with supervision;sit to/from stand  Plan Discharge plan remains appropriate    Co-evaluation                 End of Session Equipment Utilized During Treatment: Rolling walker   Activity Tolerance Patient tolerated treatment well   Patient Left in chair;with call bell/phone within reach;with chair alarm set   Nurse Communication Mobility status        Time: 1191-4782 OT Time Calculation (min): 23 min  Charges: OT General Charges $OT Visit: 1 Procedure OT Treatments $Self Care/Home Management : 23-37 mins  Caral Whan M 10/16/2015, 11:45 AM

## 2015-10-16 NOTE — Progress Notes (Signed)
Foley D/C'ed at 04:25. Will pass on to day shift RN.

## 2016-06-26 ENCOUNTER — Encounter: Payer: Self-pay | Admitting: Vascular Surgery

## 2016-07-01 ENCOUNTER — Encounter: Payer: Self-pay | Admitting: Vascular Surgery

## 2016-07-01 ENCOUNTER — Encounter: Payer: Medicare Other | Admitting: Vascular Surgery

## 2016-07-20 ENCOUNTER — Encounter: Payer: Self-pay | Admitting: Vascular Surgery

## 2016-07-24 ENCOUNTER — Encounter: Payer: Self-pay | Admitting: Vascular Surgery

## 2016-07-24 ENCOUNTER — Ambulatory Visit (INDEPENDENT_AMBULATORY_CARE_PROVIDER_SITE_OTHER): Payer: Medicare Other | Admitting: Vascular Surgery

## 2016-07-24 VITALS — BP 153/77 | HR 85 | Temp 97.5°F | Resp 18 | Ht 63.0 in | Wt 200.0 lb

## 2016-07-24 DIAGNOSIS — I739 Peripheral vascular disease, unspecified: Secondary | ICD-10-CM | POA: Diagnosis not present

## 2016-07-24 NOTE — Progress Notes (Signed)
Vitals:   07/24/16 1551  BP: (!) 160/78  Pulse: 75  Resp: 18  Temp: 97.5 F (36.4 C)  SpO2: 92%  Weight: 200 lb (90.7 kg)  Height: 5\' 3"  (1.6 m)

## 2016-07-24 NOTE — Progress Notes (Signed)
Patient ID: Kelly Garner, female   DOB: 1933-01-28, 82 y.o.   MRN: [id number]  Reason for Consult: New Evaluation (2nd opinion  cellulitis)   Referred by No ref. provider fouWendie Simmernd  Subjective:     HPI:  Kelly SimmerBetty Garner is a 82 y.o. female with atrial fibrillation and congestive heart failure and peripheral arterial disease who underwent a left-sided femoral to peroneal bypass with cadaveric vein for toe gangrene in December 2016 by Dr. Darcey [facility]as [facility]. She is certainly [facility] with fluid overload and now has ongoing edema of the left lower extremity with intermittent erythema and an area of the below-knee incision that has been slow to heal. Per the patient daughter Dr. Darcey Norarougas has evaluated this and has given them reassurance over the issue. She is now sent here for second opinion over lower extremity swelling and erythema as well as the pinpoint area of nonhealing. She continues to walk but is limited mostly by congestive heart failure. She does take daily Lasix based on her weight. She has current irritation to her skin of her left lower leg but also states that she's been applying lotion to her leg. She denies any fevers chills associated.  No family history on file. Past Surgical History:  Procedure Laterality Date  . FEMORAL-POPLITEAL BYPASS GRAFT  10/04/15    Short Social History:  Social History  Substance Use Topics  . Smoking status: Never Smoker  . Smokeless tobacco: Never Used  . Alcohol use Not on file    Allergies  Allergen Reactions  . Aspirin Swelling, Rash and Other (See Comments)  . Codeine Rash and Other (See Comments)  . Ambien [Zolpidem Tartrate] Other (See Comments)    confusion  . Macrobid [employer][Nitrofurantoin Monohyd Macro] Other (See Comments)    Affects lungs  . Nitrofurantoin Other (See Comments)    Causes a side effect with lungs  . Other Rash    Metal underneath watch caused redness to skin    Current Outpatient  Prescriptions  Medication Sig Dispense Refill  . acetaminophen (TYLENOL) 500 MG tablet Take 1 tablet (500 mg total) by mouth every 6 (six) hours as needed for mild pain or headache (DO NOT TAKE MORE THAN 500mg  of Tylenol at a time as it can damage your liver.). 30 tablet 0  . albuterol (PROVENTIL HFA;VENTOLIN HFA) 108 (90 BASE) MCG/ACT inhaler Inhale 2 puffs into the lungs every 6 (six) hours as needed for wheezing or shortness of breath.    Marland Kitchen. amLODipine (NORVASC) 10 MG tablet Take 1 tablet (10 mg total) by mouth daily. 30 tablet 0  . atorvastatin (LIPITOR) 40 MG tablet Take 40 mg by mouth daily.    . budesonide (PULMICORT) 0.5 MG/2ML nebulizer solution Take 0.5 mg by nebulization 2 (two) times daily.    . Calcium Carbonate-Vitamin D (CALTRATE 600+D PO) Take 1 tablet by mouth 2 (two) times daily.    . cetirizine (ZYRTEC) 10 MG tablet Take 10 mg by mouth daily.    . Cholecalciferol (VITAMIN D3) 5000 UNITS CAPS Take 5,000 Units by mouth every morning.    . clopidogrel (PLAVIX) 75 MG tablet Take 75 mg by mouth daily.    . diclofenac sodium (VOLTAREN) 1 % GEL Apply 2 g topically 4 (four) times daily. knee    . Ergocalciferol (VITAMIN D2) 2000 UNITS TABS Take 2,000 Units by mouth every evening.    . folic acid (FOLVITE) 1 MG tablet Take 1 mg by mouth 2 (two) times  daily.     . furosemide (LASIX) 20 MG tablet Take 20 mg by mouth as needed for fluid.    Marland Kitchen ipratropium-albuterol (DUONEB) 0.5-2.5 (3) MG/3ML SOLN Take 3 mLs by nebulization every 6 (six) hours as needed. 360 mL   . metoprolol succinate (TOPROL-XL) 50 MG 24 hr tablet Take 1 tablet (50 mg total) by mouth daily. Take with or immediately following a meal. (Patient taking differently: Take 150 mg by mouth daily. Take with or immediately following a meal.) 30 tablet 0  . omega-3 acid ethyl esters (LOVAZA) 1 G capsule Take 1 g by mouth 2 (two) times daily.     . potassium chloride SA (K-DUR,KLOR-CON) 20 MEQ tablet Take 1 tablet (20 mEq total) by  mouth daily as needed (take any day you have to take a lasix (furosemide) dose). 30 tablet 0  . probenecid (BENEMID) 500 MG tablet Take 500 mg by mouth 2 (two) times daily.    Marland Kitchen telmisartan (MICARDIS) 80 MG tablet Take 80 mg by mouth daily.     No current facility-administered medications for this visit.     Review of Systems  Constitutional: Positive for fatigue. Negative for fever.  Eyes: Eyes negative.  Respiratory: Respiratory negative.  Cardiovascular: Positive for dyspnea with exertion, leg swelling and orthopnea.  GI: Gastrointestinal negative.  Musculoskeletal: Positive for leg pain.  Skin: Positive for rash and wound.  Neurological: Neurological negative. Hematologic: Hematologic/lymphatic negative.  Psychiatric: Psychiatric negative.        Objective:  Objective   Vitals:   07/24/16 1551 07/24/16 1553  BP: (!) 160/78 (!) 153/77  Pulse: 75 85  Resp: 18   Temp: 97.5 F (36.4 C)   SpO2: 92%   Weight: 200 lb (90.7 kg)   Height: 5\' 3"  (1.6 m)    Body mass index is 35.43 kg/m.  Physical Exam  Constitutional: She is oriented to person, place, and time. She appears well-developed.  HENT:  Head: Normocephalic.  Eyes: EOM are normal.  Neck: Normal range of motion.  Cardiovascular: Normal rate.   Multiphasic peroneal signal Palpable pulsatility in bypass in popliteal fossa  Pulmonary/Chest: Effort normal.  Abdominal: Soft.  Musculoskeletal: She exhibits edema.  Neurological: She is alert and oriented to person, place, and time.  Skin:  Red irritation of left lower leg Pinpoint area of below knee incision without expressable drainage  Psychiatric: She has a normal mood and affect. Her behavior is normal. Judgment and thought content normal.        Assessment/Plan:     82 year old white female status post left femoral peroneal bypass with cadaver vein for gangrene that has now resolved and her left foot. She is here for second opinion for what appears to be  nonhealing area likely secondary to a stitch at the below-knee incision as well as erythema to her leg. On exam today she has irritation of the skin without frank erythema and there is no local fever or excess pain at the site. I can also not express any purulence from the pinpoint area in the incision and given this is CryoVein I'm less concerned about smoldering deep space infection. I would agree with Dr. Darcey Nora assessment that she did not have any intervention for this at the time. I believe she is safe for compression stockings and good skin care keeping it dry. Patient and daughter satisfied with this plan she will follow up with Dr. Darcey Nora for her lower extremity bypass evaluation. She can see me on a when  necessary basis.     Maeola Harman MD [facility]

## 2016-09-15 ENCOUNTER — Other Ambulatory Visit (HOSPITAL_COMMUNITY): Payer: Self-pay | Admitting: Surgery

## 2016-09-15 DIAGNOSIS — I739 Peripheral vascular disease, unspecified: Secondary | ICD-10-CM

## 2016-09-17 ENCOUNTER — Ambulatory Visit (HOSPITAL_COMMUNITY)
Admission: RE | Admit: 2016-09-17 | Discharge: 2016-09-17 | Disposition: A | Discharge status: Home / Self Care | Payer: Medicare Other | Source: Ambulatory Visit | Attending: Surgery | Admitting: Surgery

## 2016-09-17 DIAGNOSIS — I739 Peripheral vascular disease, unspecified: Secondary | ICD-10-CM | POA: Insufficient documentation

## 2016-09-17 DIAGNOSIS — Z95828 Presence of other vascular implants and grafts: Secondary | ICD-10-CM | POA: Diagnosis not present

## 2016-10-01 ENCOUNTER — Other Ambulatory Visit (HOSPITAL_COMMUNITY): Payer: Self-pay | Admitting: Surgery

## 2016-10-01 DIAGNOSIS — I872 Venous insufficiency (chronic) (peripheral): Secondary | ICD-10-CM

## 2016-10-01 DIAGNOSIS — I739 Peripheral vascular disease, unspecified: Secondary | ICD-10-CM

## 2016-10-08 ENCOUNTER — Ambulatory Visit (HOSPITAL_COMMUNITY): Payer: Medicare Other

## 2016-10-16 ENCOUNTER — Ambulatory Visit (HOSPITAL_COMMUNITY): Payer: Medicare Other

## 2016-10-21 ENCOUNTER — Ambulatory Visit (HOSPITAL_COMMUNITY)
Admission: RE | Admit: 2016-10-21 | Discharge: 2016-10-21 | Disposition: A | Discharge status: Home / Self Care | Payer: Medicare Other | Source: Ambulatory Visit | Attending: Surgery | Admitting: Surgery

## 2016-10-21 DIAGNOSIS — I70203 Unspecified atherosclerosis of native arteries of extremities, bilateral legs: Secondary | ICD-10-CM | POA: Insufficient documentation

## 2016-10-21 DIAGNOSIS — Z95828 Presence of other vascular implants and grafts: Secondary | ICD-10-CM | POA: Diagnosis not present

## 2016-10-21 DIAGNOSIS — K551 Chronic vascular disorders of intestine: Secondary | ICD-10-CM | POA: Diagnosis not present

## 2016-10-21 DIAGNOSIS — I872 Venous insufficiency (chronic) (peripheral): Secondary | ICD-10-CM | POA: Diagnosis not present

## 2016-10-21 DIAGNOSIS — R918 Other nonspecific abnormal finding of lung field: Secondary | ICD-10-CM | POA: Insufficient documentation

## 2016-10-21 DIAGNOSIS — I708 Atherosclerosis of other arteries: Secondary | ICD-10-CM | POA: Diagnosis not present

## 2016-10-21 DIAGNOSIS — I700 Atherosclerosis of aorta: Secondary | ICD-10-CM | POA: Diagnosis not present

## 2016-10-21 DIAGNOSIS — I739 Peripheral vascular disease, unspecified: Secondary | ICD-10-CM

## 2016-10-21 DIAGNOSIS — K5730 Diverticulosis of large intestine without perforation or abscess without bleeding: Secondary | ICD-10-CM | POA: Diagnosis not present

## 2016-10-21 LAB — POCT I-STAT CREATININE: CREATININE: 1.4 mg/dL — AB (ref 0.44–1.00)

## 2016-10-21 MED ORDER — IOPAMIDOL (ISOVUE-370) INJECTION 76%
150.0000 mL | Freq: Once | INTRAVENOUS | Status: AC | PRN
  Administered 2016-10-21: 150 mL via INTRAVENOUS

## 2016-11-10 ENCOUNTER — Other Ambulatory Visit: Payer: Self-pay

## 2016-11-10 DIAGNOSIS — I70209 Unspecified atherosclerosis of native arteries of extremities, unspecified extremity: Secondary | ICD-10-CM

## 2016-11-30 ENCOUNTER — Encounter: Payer: Self-pay | Admitting: Vascular Surgery

## 2016-12-04 ENCOUNTER — Other Ambulatory Visit: Payer: Self-pay

## 2016-12-04 ENCOUNTER — Encounter: Payer: Self-pay | Admitting: Vascular Surgery

## 2016-12-04 ENCOUNTER — Ambulatory Visit (INDEPENDENT_AMBULATORY_CARE_PROVIDER_SITE_OTHER): Payer: Medicare Other | Admitting: Vascular Surgery

## 2016-12-04 VITALS — BP 146/81 | HR 94 | Temp 98.2°F | Resp 16 | Ht 62.0 in | Wt 193.0 lb

## 2016-12-04 DIAGNOSIS — I739 Peripheral vascular disease, unspecified: Secondary | ICD-10-CM | POA: Diagnosis not present

## 2016-12-04 NOTE — Progress Notes (Signed)
Patient ID: Kelly SimmerBetty Garner, female   DOB: Jul 19, 1933, 83 y.o.   MRN: [id number]  Reason for Consult: PAD (f/u)   Referred by Veto KempsBanner, Kelli, FNP  Subjective:     HPI:  Kelly Garner is a 83 y.o. female my previously seen for wound on her left lower extremity from a previous left common femoral to peroneal artery bypass with CryoVein performed by Dr. Karene Fryriggers last year. She is a well-healed the wound in her left medial leg but has had persistent swelling has been treated by Dr. Earlene Plateravis with Roland RackUnna boots and local wound care. She presents today with chief complaint actually of a cough that she has had for several days. She does not have fevers at this time. She has healed her left medial leg wound and does not have open ulcerations at this time but continues to have swelling of her left lower extremity for which she is wearing compression stockings. There is concern for anastomotic stenosis at the proximal anastomosis of her bypass for which I'm to evaluate today. She safely does not have any wounds or rest pain in her left lower extremity at this time.  Past Medical History:  Diagnosis Date  . Atrial fibrillation (HCC)   . Congestive heart failure (CHF) (HCC)   . Coronary artery disease   . History of CVA (cerebrovascular accident)   . Peripheral vascular disease (HCC)    No family history on file. Past Surgical History:  Procedure Laterality Date  . FEMORAL-POPLITEAL BYPASS GRAFT  10/04/15    Short Social History:  Social History  Substance Use Topics  . Smoking status: Never Smoker  . Smokeless tobacco: Never Used  . Alcohol use Not on file    Allergies  Allergen Reactions  . Aspirin Swelling, Rash and Other (See Comments)  . Codeine Rash and Other (See Comments)  . Ambien [Zolpidem Tartrate] Other (See Comments)    confusion  . Macrobid [employer][Nitrofurantoin Monohyd Macro] Other (See Comments)    Affects lungs  . Nitrofurantoin Other (See Comments)    Causes a side effect with lungs    . Other Rash    Metal underneath watch caused redness to skin    Current Outpatient Prescriptions  Medication Sig Dispense Refill  . acetaminophen (TYLENOL) 500 MG tablet Take 1 tablet (500 mg total) by mouth every 6 (six) hours as needed for mild pain or headache (DO NOT TAKE MORE THAN 500mg  of Tylenol at a time as it can damage your liver.). 30 tablet 0  . albuterol (PROVENTIL HFA;VENTOLIN HFA) 108 (90 BASE) MCG/ACT inhaler Inhale 2 puffs into the lungs every 6 (six) hours as needed for wheezing or shortness of breath.    Marland Kitchen. amLODipine (NORVASC) 10 MG tablet Take 1 tablet (10 mg total) by mouth daily. (Patient taking differently: Take 5 mg by mouth daily. ) 30 tablet 0  . atorvastatin (LIPITOR) 40 MG tablet Take 40 mg by mouth daily.    . budesonide (PULMICORT) 0.5 MG/2ML nebulizer solution Take 0.5 mg by nebulization 2 (two) times daily.    . Calcium Carbonate-Vitamin D (CALTRATE 600+D PO) Take 1 tablet by mouth 2 (two) times daily.    . cetirizine (ZYRTEC) 10 MG tablet Take 10 mg by mouth daily.    . Cholecalciferol (VITAMIN D3) 5000 UNITS CAPS Take 5,000 Units by mouth every morning.    . cilostazol (PLETAL) 100 MG tablet Take 100 mg by mouth 2 (two) times daily.    . cloNIDine (CATAPRES) 0.1 MG tablet  Take 0.1 mg by mouth daily.    . clopidogrel (PLAVIX) 75 MG tablet Take 75 mg by mouth daily.    . diclofenac sodium (VOLTAREN) 1 % GEL Apply 2 g topically 4 (four) times daily. knee    . Ergocalciferol (VITAMIN D2) 2000 UNITS TABS Take 2,000 Units by mouth every evening.    . folic acid (FOLVITE) 1 MG tablet Take 1 mg by mouth 2 (two) times daily.     . furosemide (LASIX) 20 MG tablet Take 20 mg by mouth as needed for fluid.    Marland Kitchen gabapentin (NEURONTIN) 300 MG capsule Take 300 mg by mouth 2 (two) times daily.    Marland Kitchen ipratropium-albuterol (DUONEB) 0.5-2.5 (3) MG/3ML SOLN Take 3 mLs by nebulization every 6 (six) hours as needed. 360 mL   . metoprolol succinate (TOPROL-XL) 50 MG 24 hr tablet  Take 1 tablet (50 mg total) by mouth daily. Take with or immediately following a meal. (Patient taking differently: Take 100 mg by mouth daily. Take with or immediately following a meal.) 30 tablet 0  . omega-3 acid ethyl esters (LOVAZA) 1 G capsule Take 1 g by mouth 2 (two) times daily.     . potassium chloride SA (K-DUR,KLOR-CON) 20 MEQ tablet Take 1 tablet (20 mEq total) by mouth daily as needed (take any day you have to take a lasix (furosemide) dose). 30 tablet 0  . probenecid (BENEMID) 500 MG tablet Take 500 mg by mouth 2 (two) times daily.    Marland Kitchen telmisartan (MICARDIS) 80 MG tablet Take 80 mg by mouth daily.     No current facility-administered medications for this visit.     Review of Systems  Constitutional:  Constitutional negative. HENT: HENT negative.  Eyes: Eyes negative.  Respiratory: Positive for cough, shortness of breath and wheezing.  Cardiovascular: Positive for dyspnea with exertion and leg swelling.  GI: Gastrointestinal negative.  Musculoskeletal: Positive for back pain.  Skin: Positive for rash.  Neurological: Neurological negative. Hematologic: Hematologic/lymphatic negative.  Psychiatric: Psychiatric negative.        Objective:  Objective   Vitals:   12/04/16 1054 12/04/16 1057  BP: (!) 155/91 (!) 146/81  Pulse: 94 94  Resp: 16   Temp: 98.2 F (36.8 C)   SpO2: 93%   Weight: 193 lb (87.5 kg)   Height: 5\' 2"  (1.575 m)    Body mass index is 35.3 kg/m.  Physical Exam  Constitutional: She is oriented to person, place, and time. She appears well-developed.  HENT:  Head: Normocephalic.  Eyes: Pupils are equal, round, and reactive to light.  Neck: Neck supple.  Cardiovascular: Tachycardia present.   Pulses:      Femoral pulses are 2+ on the right side, and 2+ on the left side. Multiphasic signal at peroneal level of left ankle  Pulmonary/Chest: She has wheezes. She has rales.  Abdominal: Soft. She exhibits no mass.  Musculoskeletal: She exhibits  edema and deformity.  Neurological: She is alert and oriented to person, place, and time.  Psychiatric: She has a normal mood and affect. Her behavior is normal. Judgment and thought content normal.    Data: IMPRESSION: 1. Moderately depressed resting left ankle-brachial index of 0.51. Distal waveforms are monophasic. 2. Open left femoral to peroneal bypass graft. Velocity elevation in the left groin may be at the level of the distal CFA, profunda femoral artery or proximal graft anastomosis. It is difficult to determine exact level of velocity elevation based on submitted images. 3. Resting right ABI  of 0.78.  IMPRESSION: VASCULAR  Left femoral-peroneal bypass graft is patent with a severe stenosis near the origin.  Severe outflow disease in left lower extremity with segments of occlusion involving the native left SFA and popliteal artery. Extensive left runoff disease as described.  Outflow and runoff disease in the right lower extremity as described. Stented right SFA is patent with mild to moderate stenosis in the proximal SFA.  Extensive atherosclerotic disease involving the aorta without aneurysm or significant stenosis. At least mild stenosis involving multiple visceral arteries as described.     Assessment/Plan:    83 year old female history of a left common femoral to peroneal artery bypass in [place]. She is sent here for evaluation because of anastomotic stenosis at the proximal anastomosis of this bypass. She does have significant leg leg swelling and has cough and Rales on exam today. This is likely most pressing issue and the daughter will take her for evaluation likely to the emergency department following this. They fully she does not have fevers so hopefully does not have a bacterial pneumonia. We will plan an aortogram with left lower extremity runoff possible intervention of her proximal anastomosis to keep this bypass open. This was scheduled today  but they can call to reschedule should the patient not be up for undergoing. We discussed risk and benefits and agreed to proceed.     Maeola Harman MD [facility]

## 2016-12-23 ENCOUNTER — Encounter (HOSPITAL_COMMUNITY)
Admission: RE | Disposition: A | Discharge status: Home / Self Care | Payer: Self-pay | Source: Ambulatory Visit | Attending: Vascular Surgery

## 2016-12-23 ENCOUNTER — Other Ambulatory Visit: Payer: Self-pay | Admitting: *Deleted

## 2016-12-23 ENCOUNTER — Ambulatory Visit (HOSPITAL_COMMUNITY)
Admission: RE | Admit: 2016-12-23 | Discharge: 2016-12-23 | Disposition: A | Discharge status: Home / Self Care | Payer: Medicare Other | Source: Ambulatory Visit | Attending: Vascular Surgery | Admitting: Vascular Surgery

## 2016-12-23 DIAGNOSIS — I2510 Atherosclerotic heart disease of native coronary artery without angina pectoris: Secondary | ICD-10-CM | POA: Diagnosis not present

## 2016-12-23 DIAGNOSIS — I4891 Unspecified atrial fibrillation: Secondary | ICD-10-CM | POA: Diagnosis not present

## 2016-12-23 DIAGNOSIS — I739 Peripheral vascular disease, unspecified: Secondary | ICD-10-CM

## 2016-12-23 DIAGNOSIS — I509 Heart failure, unspecified: Secondary | ICD-10-CM | POA: Diagnosis not present

## 2016-12-23 DIAGNOSIS — I70202 Unspecified atherosclerosis of native arteries of extremities, left leg: Secondary | ICD-10-CM | POA: Diagnosis not present

## 2016-12-23 DIAGNOSIS — Z95828 Presence of other vascular implants and grafts: Secondary | ICD-10-CM

## 2016-12-23 DIAGNOSIS — T82898A Other specified complication of vascular prosthetic devices, implants and grafts, initial encounter: Secondary | ICD-10-CM

## 2016-12-23 DIAGNOSIS — Z8673 Personal history of transient ischemic attack (TIA), and cerebral infarction without residual deficits: Secondary | ICD-10-CM | POA: Insufficient documentation

## 2016-12-23 DIAGNOSIS — I70209 Unspecified atherosclerosis of native arteries of extremities, unspecified extremity: Secondary | ICD-10-CM | POA: Diagnosis present

## 2016-12-23 HISTORY — PX: ABDOMINAL AORTOGRAM W/LOWER EXTREMITY: CATH118223

## 2016-12-23 HISTORY — PX: PERIPHERAL VASCULAR BALLOON ANGIOPLASTY: CATH118281

## 2016-12-23 LAB — POCT I-STAT, CHEM 8
BUN: 13 mg/dL (ref 6–20)
CALCIUM ION: 1.23 mmol/L (ref 1.15–1.40)
CHLORIDE: 104 mmol/L (ref 101–111)
Creatinine, Ser: 0.8 mg/dL (ref 0.44–1.00)
Glucose, Bld: 108 mg/dL — ABNORMAL HIGH (ref 65–99)
HCT: 38 % (ref 36.0–46.0)
Hemoglobin: 12.9 g/dL (ref 12.0–15.0)
Potassium: 3.8 mmol/L (ref 3.5–5.1)
Sodium: 144 mmol/L (ref 135–145)
TCO2: 27 mmol/L (ref 0–100)

## 2016-12-23 LAB — POCT ACTIVATED CLOTTING TIME
Activated Clotting Time: 213 seconds
Activated Clotting Time: 180 seconds

## 2016-12-23 SURGERY — ABDOMINAL AORTOGRAM W/LOWER EXTREMITY
Anesthesia: LOCAL

## 2016-12-23 MED ORDER — LIDOCAINE HCL (PF) 1 % IJ SOLN
INTRAMUSCULAR | Status: AC
  Filled 2016-12-23: qty 30

## 2016-12-23 MED ORDER — FENTANYL CITRATE (PF) 100 MCG/2ML IJ SOLN
INTRAMUSCULAR | Status: DC | PRN
  Administered 2016-12-23: 50 ug via INTRAVENOUS

## 2016-12-23 MED ORDER — FENTANYL CITRATE (PF) 100 MCG/2ML IJ SOLN
INTRAMUSCULAR | Status: AC
  Filled 2016-12-23: qty 2

## 2016-12-23 MED ORDER — HEPARIN SODIUM (PORCINE) 1000 UNIT/ML IJ SOLN
INTRAMUSCULAR | Status: AC
  Filled 2016-12-23: qty 1

## 2016-12-23 MED ORDER — HEPARIN SODIUM (PORCINE) 1000 UNIT/ML IJ SOLN
INTRAMUSCULAR | Status: DC | PRN
  Administered 2016-12-23: 8000 [IU] via INTRAVENOUS

## 2016-12-23 MED ORDER — LABETALOL HCL 5 MG/ML IV SOLN
INTRAVENOUS | Status: AC
  Filled 2016-12-23: qty 4

## 2016-12-23 MED ORDER — SODIUM CHLORIDE 0.9 % IV SOLN: 1.0000 mL/kg/h | INTRAVENOUS | Status: DC

## 2016-12-23 MED ORDER — HEPARIN (PORCINE) IN NACL 2-0.9 UNIT/ML-% IJ SOLN
INTRAMUSCULAR | Status: DC | PRN
  Administered 2016-12-23: 1000 mL

## 2016-12-23 MED ORDER — LIDOCAINE HCL (PF) 1 % IJ SOLN
INTRAMUSCULAR | Status: DC | PRN
  Administered 2016-12-23: 15 mL

## 2016-12-23 MED ORDER — MIDAZOLAM HCL 2 MG/2ML IJ SOLN
INTRAMUSCULAR | Status: DC | PRN
  Administered 2016-12-23: 0.5 mg via INTRAVENOUS

## 2016-12-23 MED ORDER — SODIUM CHLORIDE 0.9 % IV SOLN
INTRAVENOUS | Status: DC
  Administered 2016-12-23: 13:00:00 via INTRAVENOUS

## 2016-12-23 MED ORDER — HEPARIN (PORCINE) IN NACL 2-0.9 UNIT/ML-% IJ SOLN
INTRAMUSCULAR | Status: AC
  Filled 2016-12-23: qty 1000

## 2016-12-23 MED ORDER — IODIXANOL 320 MG/ML IV SOLN
INTRAVENOUS | Status: DC | PRN
  Administered 2016-12-23: 130 mL via INTRA_ARTERIAL

## 2016-12-23 MED ORDER — OXYCODONE-ACETAMINOPHEN 5-325 MG PO TABS: 1.0000 | ORAL_TABLET | ORAL | Status: DC | PRN

## 2016-12-23 MED ORDER — MORPHINE SULFATE (PF) 4 MG/ML IV SOLN: 2.0000 mg | INTRAVENOUS | Status: DC | PRN

## 2016-12-23 MED ORDER — LABETALOL HCL 5 MG/ML IV SOLN
20.0000 mg | Freq: Once | INTRAVENOUS | Status: AC
  Administered 2016-12-23 (×2): 10 mg via INTRAVENOUS

## 2016-12-23 MED ORDER — MIDAZOLAM HCL 2 MG/2ML IJ SOLN
INTRAMUSCULAR | Status: AC
  Filled 2016-12-23: qty 2

## 2016-12-23 SURGICAL SUPPLY — 20 items
BALLN ANGIOSCULPT OTW 5X40 (BALLOONS) ×3
BALLOON ANGIOSCULPT OTW 5X40 (BALLOONS) ×2 IMPLANT
CATH CXI 2.6F 90 ANG (CATHETERS) ×1
CATH OMNI FLUSH 5F 65CM (CATHETERS) ×3 IMPLANT
CATH SOFT-VU 4F 65 STRAIGHT (CATHETERS) ×2 IMPLANT
CATH SOFT-VU STRAIGHT 4F 65CM (CATHETERS) ×1
CATH SPRT ANG 90X2.6FR (CATHETERS) ×2 IMPLANT
COVER PRB 48X5XTLSCP FOLD TPE (BAG) ×2 IMPLANT
COVER PROBE 5X48 (BAG) ×1
KIT ENCORE 26 ADVANTAGE (KITS) ×3 IMPLANT
KIT PV (KITS) ×3 IMPLANT
SHEATH FLEX ANSEL ST 6FR 45CM (SHEATH) ×3 IMPLANT
SHEATH PINNACLE 5F 10CM (SHEATH) ×3 IMPLANT
SYR MEDRAD MARK V 150ML (SYRINGE) ×3 IMPLANT
TRANSDUCER W/STOPCOCK (MISCELLANEOUS) ×3 IMPLANT
TRAY PV CATH (CUSTOM PROCEDURE TRAY) ×3 IMPLANT
WIRE BENTSON .035X145CM (WIRE) ×3 IMPLANT
WIRE G V18X300CM (WIRE) ×3 IMPLANT
WIRE MINI STICK MAX (SHEATH) ×3 IMPLANT
WIRE ROSEN-J .035X180CM (WIRE) ×3 IMPLANT

## 2016-12-23 NOTE — Progress Notes (Signed)
Site area: rt groin Site Prior to Removal:  Level 0 Pressure Applied For: 20 minutes Manual:   yes Patient Status During Pull:  stable Post Pull Site:  Level 0 Post Pull Instructions Given:  yes yesPost Pull Pulses Present: dopplered Dressing Applied:  Gauze and tegaderm Bedrest begins @ 1630 Comments:

## 2016-12-23 NOTE — Op Note (Signed)
    Patient name: Kelly Garner MRN: [id number] DOB: 12-Oct-1933 Sex: female  12/23/2016 Pre-operative Diagnosis: left lower extremity bypass anastomotic stricture Post-operative diagnosis:  Same Surgeon:  Apolinar JunesBrandon C. Randie Heinzain, MD Procedure Performed: 1.  US guided cannulation of right common femoral artery 2.  Aortogram with bilateral lower extremity runoff 3.  Selective cannulation of left sfa 4.  Balloon angioplasty of proximal anastomosis with 5mm angiosculpt 5.  Moderate sedation with fentanyl and versed for 40 minutes  Indications:  83 year old female previous left femoral to peroneal artery bypass with CryoVein. She now has evidence of anastomotic stricture with concern for impending bypass failure. She is therefore indicated for above procedure.  Findings: Aorta and iliac segments are patent without significant stenoses. The right lower extremity there is a patent sfa stent with multiple non-flow-limiting stenoses. The left lower extremity there is a femoral to peroneal artery bypass with pericardial artery runoff to the level of the ankle. There is a greater than 60% stenoses at the anastomoses that with balloon angioplasty fully resolved to 0%.   Procedure:  The patient was identified in the holding area and taken to room 8.  The patient was then placed supine on the table and prepped and draped in the usual sterile fashion.  A time out was called.  Ultrasound was used to evaluate the right common femoral artery.  It was patent .  A digital ultrasound image was acquired.  A micropuncture needle was used to access the right common femoral artery under ultrasound guidance.  An 018 wire was advanced without resistance and a micropuncture sheath was placed.  The 018 wire was removed and a benson wire was placed.  The micropuncture sheath was exchanged for a 5 french sheath.  An omniflush catheter was advanced over the wire to the level of L-1.  An abdominal angiogram was obtained followed by bilateral  runoff.  Next, using the omniflush catheter and a benson wire, the aortic bifurcation was crossed and the catheter was placed into theleft external iliac artery and in the bypass was evaluated with above-noted stenoses. Patient was heparinized and we exchanged for long 6 French sheath. Bypass was cannulated with the 18 wire and confirmed with angiogram of the bypass. We then performed 5 mm balloon angioplasty with angiosculpt balloon proximal anastomosis. Follow-up angiogram demonstrated resolution of the stenosis. The wire we then selected the distal part of the anastomosis performed angiogram of the lower leg demonstrating the above findings of peroneal runoff. With this catheter wire were removed sheath withdrawn into the right external iliac artery. Patient procedure well without immediate complication.   Contrast: 130cc  Sevastian Witczak C. Randie Heinzain, MD [facility] [place] Office: [phone] Pager: [phone]

## 2016-12-23 NOTE — H&P (Signed)
     History and Physical Update  The patient was interviewed and re-examined.  The patient's previous History and Physical has been reviewed and is unchanged from recent office visit.   Brandon C. Randie Heinzain, MD [facility] [place] Office: [phone] Pager: [phone]   12/23/2016, 12:59 PM

## 2016-12-23 NOTE — Discharge Instructions (Signed)

## 2016-12-24 ENCOUNTER — Encounter (HOSPITAL_COMMUNITY): Payer: Self-pay | Admitting: Vascular Surgery

## 2016-12-24 ENCOUNTER — Telehealth: Payer: Self-pay | Admitting: Vascular Surgery

## 2016-12-24 NOTE — Telephone Encounter (Signed)
Scheduled patient for LE Arterial+ ABI+ Post Op on 4/20 at 9:30.

## 2016-12-24 NOTE — Telephone Encounter (Signed)
-----   Message from Marilyn K McChesney, RN sent at 12/23/2016  3:03 PM EST ----- RegSharee Pimplearding: 4-6 weeks w/ 2 labs   ----- Message ----- From: Maeola HarmanBrandon Christopher Cain, MD Sent: 12/23/2016   2:52 PM To: [facility] Charge Pool  Wendie SimmerBetty Kestler [id number] 04/24/1933  12/23/2016 Pre-operative Diagnosis: left lower extremity bypass anastomotic stricture  Surgeon:  Apolinar JunesBrandon C. Randie Heinzain, MD  Procedure Performed: 1.  US guided cannulation of right common femoral artery 2.  Aortogram with bilateral lower extremity runoff 3.  Selective cannulation of left sfa 4.  Balloon angioplasty of proximal anastomosis with 5mm angiosculpt 5.  Moderate sedation with fentanyl and versed for 40 minutes  F/u in 4-6 weeks with repeat ABI and left lower extremity bypass duplex

## 2016-12-24 NOTE — Telephone Encounter (Signed)
What about Wednesday 01-27-17, that is his extra day? We really need to find a day sooner; can we switch some protocol patients to PA?

## 2016-12-24 NOTE — Telephone Encounter (Signed)
The problem is the lab. I'll look and see if I can find something.

## 2017-01-26 ENCOUNTER — Encounter: Payer: Self-pay | Admitting: Vascular Surgery

## 2017-02-05 ENCOUNTER — Ambulatory Visit (HOSPITAL_COMMUNITY)
Admission: RE | Admit: 2017-02-05 | Discharge: 2017-02-05 | Disposition: A | Discharge status: Home / Self Care | Payer: Medicare Other | Source: Ambulatory Visit | Attending: Vascular Surgery | Admitting: Vascular Surgery

## 2017-02-05 ENCOUNTER — Encounter: Payer: Self-pay | Admitting: Vascular Surgery

## 2017-02-05 ENCOUNTER — Ambulatory Visit (INDEPENDENT_AMBULATORY_CARE_PROVIDER_SITE_OTHER): Payer: Self-pay | Admitting: Vascular Surgery

## 2017-02-05 VITALS — BP 159/92 | HR 87 | Temp 97.3°F | Resp 18 | Ht 62.0 in | Wt 196.0 lb

## 2017-02-05 DIAGNOSIS — I739 Peripheral vascular disease, unspecified: Secondary | ICD-10-CM

## 2017-02-05 DIAGNOSIS — I10 Essential (primary) hypertension: Secondary | ICD-10-CM | POA: Insufficient documentation

## 2017-02-05 DIAGNOSIS — E785 Hyperlipidemia, unspecified: Secondary | ICD-10-CM | POA: Diagnosis not present

## 2017-02-05 DIAGNOSIS — Z95828 Presence of other vascular implants and grafts: Secondary | ICD-10-CM

## 2017-02-05 NOTE — Progress Notes (Signed)
Vitals:   02/05/17 1215  BP: (!) 169/90  Pulse: 87  Resp: 18  Temp: 97.3 F (36.3 C)  SpO2: 93%  Weight: 196 lb (88.9 kg)  Height:  (1.575 m)

## 2017-02-05 NOTE — Progress Notes (Signed)
Subjective:     Patient ID: Kelly Garner, female   DOB: [birth year], 83 y.o.   MRN: [id number]  HPI Kelly Garner returns for follow-up of previous balloon angioplasty of her left lower extremity bypass stricture. Her bypass was performed in [place] with CryoVein to her peroneal artery. We identified a 60% stenosis at the proximal anastomosis which had resolved with balloon angioplasty. She is now doing very well without left lower extremity pain or swelling is much better and she is doing much better breathing and she has her Lasix adjusted. She has no complaints relative today's visit.   Review of Systems No complaints today    Objective:   Physical Exam aaox3 Non labored respirations Abdomen is soft Minimal edema ble Strong left peroneal signal and dp    Assessment/plan     83 year old female history of a left femoral to peroneal artery bypass for a wound. She had a anastomotic stricture proximally which resolved with balloon angioplasty. She is now here for follow-up and has duplex demonstrating elevated velocities proximal and distal anastomosis but normal velocities between. She also has an improvement in her ABI 0.9 on the left side. Given that she has no complaints we can follow her up in 6 months with repeat ABIs and left lower external bypass duplex.  Brandon C. Randie Heinz, MD [facility] [place] Office: [phone] Pager: [phone]

## 2017-02-08 NOTE — Addendum Note (Signed)
Addended by: Santana Edell A on: 02/08/2017 09:17 AM   Modules accepted: Orders  

## 2017-02-19 ENCOUNTER — Encounter: Payer: Medicare Other | Admitting: Vascular Surgery

## 2017-02-19 ENCOUNTER — Encounter (HOSPITAL_COMMUNITY): Payer: Medicare Other

## 2017-08-13 ENCOUNTER — Encounter (HOSPITAL_COMMUNITY): Payer: Medicare Other

## 2017-08-13 ENCOUNTER — Ambulatory Visit: Payer: Medicare Other | Admitting: Vascular Surgery

## 2017-10-22 ENCOUNTER — Encounter (HOSPITAL_COMMUNITY): Payer: Medicare Other

## 2017-10-22 ENCOUNTER — Ambulatory Visit: Payer: Medicare Other | Admitting: Vascular Surgery

## 2018-03-09 ENCOUNTER — Encounter (INDEPENDENT_AMBULATORY_CARE_PROVIDER_SITE_OTHER): Payer: Self-pay | Admitting: Internal Medicine

## 2018-03-09 ENCOUNTER — Ambulatory Visit (INDEPENDENT_AMBULATORY_CARE_PROVIDER_SITE_OTHER): Payer: Medicare Other | Admitting: Internal Medicine

## 2018-03-09 VITALS — BP 122/60 | HR 84 | Temp 97.3°F | Ht 63.0 in | Wt 209.5 lb

## 2018-03-09 DIAGNOSIS — R197 Diarrhea, unspecified: Secondary | ICD-10-CM | POA: Diagnosis not present

## 2018-03-09 HISTORY — DX: Diarrhea, unspecified: R19.7

## 2018-03-09 NOTE — Progress Notes (Addendum)
Subjective:    Patient ID: Kelly Garner, female    DOB: 27-Feb-1933, 84 y.o.   MRN: [id number] Daughter in room providing most of hx. HPI Referred b y Dr. Levell July for chronic diarrhea. Has seen Dr. Allena Katz in the past for same. She has hx of C-diff x 2.  She wears Depends. Her last bout of C-diff was in March at [facility]. She was treated with Flagyl.  Daughter states she is still having diarrhea.  She is on 2 antibiotics for a cellulitis in both legs and a UTI. One of the antibiotics was Keflex. ? Other.  Appetite is okay. No weight loss.  She has a BM daily. Sometimes her stools are formed and some are not.  She is taking Zenpep for the diarrhea.  Hx of CVA and irregular heart (atrial fib) and  imaintained on Plavix Her last colonoscopy was 2 years ago.  02/22/2018 H and H 12.1 and 37.1, total bili 0.7, ALP 154, AST 28, ALT 25 Review of Systems Past Medical History:  Diagnosis Date  . Atrial fibrillation (HCC)   . Congestive heart failure (CHF) (HCC)   . Coronary artery disease   . History of CVA (cerebrovascular accident)   . Peripheral vascular disease [facility])     Past Surgical History:  Procedure Laterality Date  . ABDOMINAL AORTOGRAM W/LOWER EXTREMITY N/A 12/23/2016   Procedure: Abdominal Aortogram w/Lower Extremity;  Surgeon: Maeola Harman, MD;  Location[facility] INVAS[facility] CV LAB;  Service: Cardiovascular;  Laterality: N/A;  . FEMORAL-POPLITEAL BYPASS GRAFT  10/04/15  . PERIPHERAL VASCULAR BALLOON ANGIOPLASTY  12/23/2016   Procedure: Peripheral Vascular Balloon Angioplasty;  Surgeon: Maeola Harman, MD;  Location: [facility] INVASIVE CV LAB;  Service: Cardiovascular;;    Allergies  Allergen Reactions  . Ambien [Zolpidem Tartrate] Shortness Of Breath and Other (See Comments)    confusion  . Aspirin Swelling and Rash  . Codeine Rash  . Macrobid [employer] Macro] Other (See Comments)    Affects lungs for long term use, ok to use for short term purposes   .  Other Rash    Nickel underneath watch caused redness to skin    Current Outpatient Medications on File Prior to Visit  Medication Sig Dispense Refill  . amLODipine (NORVASC) 10 MG tablet Take 1 tablet (10 mg total) by mouth daily. (Patient taking differently: Take 5 mg by mouth daily. ) 30 tablet 0  . atorvastatin (LIPITOR) 40 MG tablet Take 40 mg by mouth every evening.     . Calcium Carbonate-Vitamin D (CALTRATE 600+D PO) Take 1 tablet by mouth 2 (two) times daily.    . cilostazol (PLETAL) 100 MG tablet Take 100 mg by mouth 2 (two) times daily.    . clopidogrel (PLAVIX) 75 MG tablet Take 75 mg by mouth daily.    . folic acid (FOLVITE) 1 MG tablet Take 1 mg by mouth 2 (two) times daily.     Marland Kitchen gabapentin (NEURONTIN) 300 MG capsule Take 300 mg by mouth 2 (two) times daily.    Marland Kitchen ipratropium-albuterol (DUONEB) 0.5-2.5 (3) MG/3ML SOLN Take 3 mLs by nebulization every 6 (six) hours as needed. (Patient taking differently: Take 3 mLs by nebulization every 6 (six) hours as needed (shortness of breath). ) 360 mL   . metFORMIN (GLUCOPHAGE) 500 MG tablet Take by mouth daily with breakfast.    . metoprolol succinate (TOPROL-XL) 100 MG 24 hr tablet Take 100 mg by mouth at bedtime. Take with or immediately following a  meal.    . omega-3 acid ethyl esters (LOVAZA) 1 G capsule Take 1 g by mouth 2 (two) times daily.     . potassium chloride SA (K-DUR,KLOR-CON) 20 MEQ tablet Take 1 tablet (20 mEq total) by mouth daily as needed (take any day you have to take a lasix (furosemide) dose). 30 tablet 0  . probenecid (BENEMID) 500 MG tablet Take 500 mg by mouth 2 (two) times daily.    . Probiotic CAPS Take 1 capsule by mouth daily.    Marland Kitchen telmisartan (MICARDIS) 80 MG tablet Take 80 mg by mouth daily.    Marland Kitchen telmisartan (MICARDIS) 80 MG tablet Take 80 mg by mouth daily.    . vitamin B-12 (CYANOCOBALAMIN) 1000 MCG tablet Take 1,000 mcg by mouth daily. Monday through Friday    . acetaminophen (TYLENOL) 500 MG tablet Take 1  tablet (500 mg total) by mouth every 6 (six) hours as needed for mild pain or headache (DO NOT TAKE MORE THAN  of Tylenol at a time as it can damage your liver.). (Patient taking differently: Take 500-1,000 mg by mouth every 6 (six) hours as needed for mild pain or headache. ) 30 tablet 0  . albuterol (PROVENTIL HFA;VENTOLIN HFA) 108 (90 BASE) MCG/ACT inhaler Inhale 2 puffs into the lungs every 6 (six) hours as needed for wheezing or shortness of breath.    . cetirizine (ZYRTEC) 10 MG tablet Take 10 mg by mouth at bedtime.     . cloNIDine (CATAPRES) 0.1 MG tablet Take 0.1 mg by mouth every evening.     . diclofenac sodium (VOLTAREN) 1 % GEL Apply 2 g topically daily as needed (pain). knee     . diphenoxylate-atropine (LOMOTIL) 2.5-0.025 MG tablet Take by mouth 4 (four) times daily as needed for diarrhea or loose stools.    . Ergocalciferol (VITAMIN D2) 2000 UNITS TABS Take 2,000 Units by mouth every evening.    . furosemide (LASIX) 40 MG tablet Take 40 mg by mouth daily as needed for edema.    . nitrofurantoin, macrocrystal-monohydrate, (MACROBID) 100 MG capsule Take 100 mg by mouth 2 (two) times daily.  0   No current facility-administered medications on file prior to visit.         Objective:   Physical Exam Blood pressure 122/60, pulse 84, temperature (!) 97.3 F (36.3 C), height  (1.6 m), weight 209 lb 8 oz (95 kg). Alert and oriented. Skin warm and dry. Oral mucosa is moist.   . Sclera anicteric, conjunctivae is pink. Thyroid not enlarged. No cervical lymphadenopathy. Lungs clear. Heart regular rate and rhythm.  Abdomen is soft. Bowel sounds are positive. No hepatomegaly. No abdominal masses felt. No tenderness.  Cellulitis to both lower extremities.        Assessment & Plan:  Chronic diarrhea. Am going to get stool studies. Fiber 4 gms OTC daily.  Toileting on a schedule.   Further recommendations to follow.  Will get last colonoscopy report.

## 2018-03-09 NOTE — Patient Instructions (Signed)
GI pathogen. Further recommendations to follow.  

## 2018-04-01 ENCOUNTER — Ambulatory Visit: Payer: Medicare Other | Admitting: Vascular Surgery

## 2018-04-07 ENCOUNTER — Ambulatory Visit (INDEPENDENT_AMBULATORY_CARE_PROVIDER_SITE_OTHER): Payer: Medicare Other | Admitting: Physician Assistant

## 2018-04-07 ENCOUNTER — Other Ambulatory Visit: Payer: Self-pay

## 2018-04-07 VITALS — BP 167/75 | HR 85 | Temp 97.6°F | Resp 20 | Ht 63.0 in | Wt 200.0 lb

## 2018-04-07 DIAGNOSIS — I739 Peripheral vascular disease, unspecified: Secondary | ICD-10-CM

## 2018-04-07 DIAGNOSIS — R600 Localized edema: Secondary | ICD-10-CM | POA: Diagnosis not present

## 2018-04-07 DIAGNOSIS — Z95828 Presence of other vascular implants and grafts: Secondary | ICD-10-CM

## 2018-04-07 NOTE — Progress Notes (Signed)
Established Previous Bypass   History of Present Illness   Wendie SimmerBetty Eutsler is a 84 y.o. (September 12, 1933) female who presents for follow up with new blistering and edema of RLE.  Past medical history significant for venous insufficiency as well as peripheral arterial disease with history of left femoral to peroneal artery bypass with Cryo[place] which was [place] in [place] in December 2016.  In February 2018 Dr. Pascal LuxKane performed balloon angioplasty of proximal anastomosis however she has been lost to follow-up since that time.  Over the years she has had trouble with edema of bilateral lower extremities with concurrent weeping and venous ulceration.  She was seen by her primary care provider about 2 weeks ago who prescribed an antibiotic due to cellulitis of her right leg and also increased her Lasix dose for 1 week.  The patient as well as her daughter agree that her right leg looks much better since that time.  At the time of visit today she does not show any weeping and only one small area of blistering is noted on her right lateral lower leg.  She denies any claudication, rest pain, or active tissue ischemia of bilateral lower extremities.  She also denies fevers, chills, nausea/vomiting.  The daughter states she has to remind her mother to elevate her legs during the day.  The patient would like to avoid an Radio broadcast assistantUnna boot today if at all possible.  She is taking her Plavix and statin daily.  She is ambulatory with a cane.  The patient's PMH, PSH, SH, and FamHx were reviewed on today's visit and are unchanged from prior visit.  Current Outpatient Medications  Medication Sig Dispense Refill  . albuterol (PROVENTIL HFA;VENTOLIN HFA) 108 (90 BASE) MCG/ACT inhaler Inhale 2 puffs into the lungs every 6 (six) hours as needed for wheezing or shortness of breath.    Marland Kitchen. amLODipine (NORVASC) 10 MG tablet Take 1 tablet (10 mg total) by mouth daily. (Patient taking differently: Take 5 mg by mouth daily. ) 30 tablet 0    . atorvastatin (LIPITOR) 40 MG tablet Take 40 mg by mouth every evening.     . budesonide (PULMICORT) 0.5 MG/2ML nebulizer solution Take 0.5 mg by nebulization 2 (two) times daily.    . cetirizine (ZYRTEC) 10 MG tablet Take 10 mg by mouth at bedtime.     . cilostazol (PLETAL) 100 MG tablet Take 100 mg by mouth 2 (two) times daily.    . cloNIDine (CATAPRES) 0.1 MG tablet Take 0.1 mg by mouth every evening.     . clopidogrel (PLAVIX) 75 MG tablet Take 75 mg by mouth daily.    . diclofenac sodium (VOLTAREN) 1 % GEL Apply 2 g topically daily as needed (pain). knee     . Ergocalciferol (VITAMIN D2) 2000 UNITS TABS Take 2,000 Units by mouth every evening.    . folic acid (FOLVITE) 1 MG tablet Take 1 mg by mouth 2 (two) times daily.     . furosemide (LASIX) 40 MG tablet Take 40 mg by mouth daily as needed for edema.    . gabapentin (NEURONTIN) 300 MG capsule Take 300 mg by mouth 2 (two) times daily.    Marland Kitchen. ipratropium-albuterol (DUONEB) 0.5-2.5 (3) MG/3ML SOLN Take 3 mLs by nebulization every 6 (six) hours as needed. (Patient taking differently: Take 3 mLs by nebulization every 6 (six) hours as needed (shortness of breath). ) 360 mL   . metFORMIN (GLUCOPHAGE) 500 MG tablet Take by mouth daily with breakfast.    .  metoprolol succinate (TOPROL-XL) 100 MG 24 hr tablet Take 50 mg by mouth at bedtime. Take with or immediately following a meal.     . metoprolol succinate (TOPROL-XL) 100 MG 24 hr tablet Take 100 mg by mouth at bedtime. Take with or immediately following a meal.    . omega-3 acid ethyl esters (LOVAZA) 1 G capsule Take 1 g by mouth 2 (two) times daily.     Marland Kitchen omeprazole (PRILOSEC) 10 MG capsule Take by mouth daily.    . Pancrelipase, Lip-Prot-Amyl, (ZENPEP PO) Take by mouth. One tab with each meal for diarrhea    . potassium chloride SA (K-DUR,KLOR-CON) 20 MEQ tablet Take 1 tablet (20 mEq total) by mouth daily as needed (take any day you have to take a lasix (furosemide) dose). 30 tablet 0  .  probenecid (BENEMID) 500 MG tablet Take 500 mg by mouth 2 (two) times daily.    . Probiotic CAPS Take 1 capsule by mouth daily.    Marland Kitchen telmisartan (MICARDIS) 80 MG tablet Take 80 mg by mouth daily.    Marland Kitchen telmisartan (MICARDIS) 80 MG tablet Take 80 mg by mouth daily.    . vitamin B-12 (CYANOCOBALAMIN) 1000 MCG tablet Take 1,000 mcg by mouth daily. Monday through Friday     No current facility-administered medications for this visit.     On ROS today: 10 system ROS is negative unless otherwise noted in HPI   Physical Examination   Vitals:   04/07/18 1422  BP: (!) 167/75  Pulse: 85  Resp: 20  Temp: 97.6 F (36.4 C)  TempSrc: Oral  SpO2: 95%  Weight: 200 lb (90.7 kg)  Height: 5\' 3"  (1.6 m)   Body mass index is 35.43 kg/m.  General Alert, O x 3, WD, NAD  Pulmonary Sym exp, good B air movt,   Cardiac RRR, Nl S1, S2,   Vascular Vessel Right Left  Radial Palpable Palpable  Aorta Not palpable N/A  Popliteal Not palpable Not palpable  PT Faintly palpable Not palpable  DP Not palpable Brisk Doppler signal    Gastro- intestinal soft, non-distended,  Musculo- skeletal  well-healed below the knee popliteal incision left leg; feet are symmetrically warm to touch with good capillary refill; pitting edema to the level of the knee right leg much more than left; small raw area right lateral distal lower leg without weeping; low intensity erythema circumferentially around right ankle; dry cracking skin noted diffusely over right lower leg; numerous dry eschar right shin  Neurologic Pain and light touch intact in extremities , Motor exam as listed above     Medical Decision Making   Lorieann Argueta is a 84 y.o. female who presents with edema and healing blisters of right lower leg   Based on exam and story provided by daughter and patient her right leg has responded well to antibiotic regimen as well as increase in Lasix dose  Strongly recommended elevation of right leg as well as  compression daily  I explained that if new blisters or open areas develop on her right leg she will need to call the office and schedule a visit to have a Unna boot applied.  Patent left lower extremity bypass given strong peripheral Doppler signals  We will check graft surveillance and ABIs in August and patient will follow up with Dr. Randie Heinz  Continue Plavix and statin daily  Emilie Rutter, PA-C [facility] Office: [phone]

## 2018-05-20 ENCOUNTER — Other Ambulatory Visit: Payer: Self-pay

## 2018-05-20 ENCOUNTER — Other Ambulatory Visit: Payer: Self-pay | Admitting: Vascular Surgery

## 2018-05-20 ENCOUNTER — Ambulatory Visit (INDEPENDENT_AMBULATORY_CARE_PROVIDER_SITE_OTHER)
Admission: RE | Admit: 2018-05-20 | Discharge: 2018-05-20 | Disposition: A | Discharge status: Home / Self Care | Payer: Medicare Other | Source: Ambulatory Visit | Attending: Vascular Surgery | Admitting: Vascular Surgery

## 2018-05-20 ENCOUNTER — Ambulatory Visit (HOSPITAL_COMMUNITY)
Admission: RE | Admit: 2018-05-20 | Discharge: 2018-05-20 | Disposition: A | Discharge status: Home / Self Care | Payer: Medicare Other | Source: Ambulatory Visit | Attending: Vascular Surgery | Admitting: Vascular Surgery

## 2018-05-20 ENCOUNTER — Encounter: Payer: Self-pay | Admitting: Vascular Surgery

## 2018-05-20 ENCOUNTER — Ambulatory Visit (INDEPENDENT_AMBULATORY_CARE_PROVIDER_SITE_OTHER): Payer: Medicare Other | Admitting: Vascular Surgery

## 2018-05-20 VITALS — BP 158/60 | HR 73 | Temp 97.0°F | Resp 20 | Ht 63.0 in | Wt 213.0 lb

## 2018-05-20 DIAGNOSIS — T82858A Stenosis of vascular prosthetic devices, implants and grafts, initial encounter: Secondary | ICD-10-CM | POA: Diagnosis not present

## 2018-05-20 DIAGNOSIS — I739 Peripheral vascular disease, unspecified: Secondary | ICD-10-CM

## 2018-05-20 DIAGNOSIS — I890 Lymphedema, not elsewhere classified: Secondary | ICD-10-CM | POA: Diagnosis not present

## 2018-05-20 DIAGNOSIS — Y832 Surgical operation with anastomosis, bypass or graft as the cause of abnormal reaction of the patient, or of later complication, without mention of misadventure at the time of the procedure: Secondary | ICD-10-CM | POA: Diagnosis not present

## 2018-05-20 NOTE — Progress Notes (Signed)
Patient ID: Kelly Garner, female   DOB: 12-08-1932, 84 y.o.   MRN: [id number]  Reason for Consult: PAD (6 mth f/u with ABI, LE Arterial. )   Referred by Dorice Lamas, *  Subjective:     HPI:  Kelly Garner is a 84 y.o. female returns for follow-up visit to evaluate her bilateral lower extremities.  She has a previous femme peroneal bypass that was performed in [place] in the past.  She subsequently underwent angioplasty of her proximal anastomosis and 2018 with me.  She has significant swelling of her bilateral lower extremities for which she is scheduled to get lymphedema pumps.  She had ABIs and duplex is performed prior to today.  She has had some areas of weeping but currently not with with diuresis.  She is elevating her legs maybe not as much as she should be.  Currently has no tissue loss or ulceration per  Past Medical History:  Diagnosis Date  . Atrial fibrillation (HCC)   . Congestive heart failure (CHF) (HCC)   . Coronary artery disease   . Diarrhea 03/09/2018  . History of CVA (cerebrovascular accident)   . Peripheral vascular disease (HCC)    No family history on file. Past Surgical History:  Procedure Laterality Date  . ABDOMINAL AORTOGRAM W/LOWER EXTREMITY N/A 12/23/2016   Procedure: Abdominal Aortogram w/Lower Extremity;  Surgeon: Maeola Harman, MD;  Location: [facility] INVASIVE CV LAB;  Service: Cardiovascular;  Laterality: N/A;  . CORONARY ARTERY BYPASS GRAFT     2000  . FEMORAL-POPLITEAL BYPASS GRAFT  10/04/15  . PERIPHERAL VASCULAR BALLOON ANGIOPLASTY  12/23/2016   Procedure: Peripheral Vascular Balloon Angioplasty;  Surgeon: Maeola Harman, MD;  Location: [facility] INVASIVE CV LAB;  Service: Cardiovascular;;    Short Social History:  Social History   Tobacco Use  . Smoking status: Never Smoker  . Smokeless tobacco: Never Used  Substance Use Topics  . Alcohol use: Never    Alcohol/week: 0.0 oz    Frequency: Never    Allergies  Allergen  Reactions  . Ambien [Zolpidem Tartrate] Shortness Of Breath and Other (See Comments)    confusion  . Aspirin Swelling and Rash  . Codeine Rash  . Macrobid [employer] Macro] Other (See Comments)    Affects lungs for long term use, ok to use for short term purposes   . Other Rash    Nickel underneath watch caused redness to skin    Current Outpatient Medications  Medication Sig Dispense Refill  . albuterol (PROVENTIL HFA;VENTOLIN HFA) 108 (90 BASE) MCG/ACT inhaler Inhale 2 puffs into the lungs every 6 (six) hours as needed for wheezing or shortness of breath.    Marland Kitchen amLODipine (NORVASC) 10 MG tablet Take 1 tablet (10 mg total) by mouth daily. (Patient taking differently: Take 5 mg by mouth daily. ) 30 tablet 0  . atorvastatin (LIPITOR) 40 MG tablet Take 40 mg by mouth every evening.     . budesonide (PULMICORT) 0.5 MG/2ML nebulizer solution Take 0.5 mg by nebulization 2 (two) times daily.    . calcium carbonate (CALCIUM 600) 1500 (600 Ca) MG TABS tablet Take by mouth 2 (two) times daily with a meal.    . cetirizine (ZYRTEC) 10 MG tablet Take 10 mg by mouth at bedtime.     . cilostazol (PLETAL) 100 MG tablet Take 100 mg by mouth 2 (two) times daily.    . cloNIDine (CATAPRES) 0.1 MG tablet Take 0.1 mg by mouth every evening.     Marland Kitchen  clopidogrel (PLAVIX) 75 MG tablet Take 75 mg by mouth daily.    . diclofenac sodium (VOLTAREN) 1 % GEL Apply 2 g topically daily as needed (pain). knee     . doxycycline (VIBRAMYCIN) 100 MG capsule     . Ergocalciferol (VITAMIN D2) 2000 UNITS TABS Take 2,000 Units by mouth every evening.    . furosemide (LASIX) 40 MG tablet Take 40 mg by mouth daily as needed for edema.    . gabapentin (NEURONTIN) 300 MG capsule Take 300 mg by mouth 2 (two) times daily.    Marland Kitchen. ipratropium-albuterol (DUONEB) 0.5-2.5 (3) MG/3ML SOLN Take 3 mLs by nebulization every 6 (six) hours as needed. (Patient taking differently: Take 3 mLs by nebulization every 6 (six) hours as needed  (shortness of breath). ) 360 mL   . metFORMIN (GLUCOPHAGE) 500 MG tablet Take by mouth daily with breakfast.    . metoprolol succinate (TOPROL-XL) 100 MG 24 hr tablet Take 50 mg by mouth at bedtime. Take with or immediately following a meal.     . metoprolol succinate (TOPROL-XL) 100 MG 24 hr tablet Take 100 mg by mouth at bedtime. Take with or immediately following a meal.    . omega-3 acid ethyl esters (LOVAZA) 1 G capsule Take 1 g by mouth 2 (two) times daily.     Marland Kitchen. omeprazole (PRILOSEC) 10 MG capsule Take by mouth daily.    . Pancrelipase, Lip-Prot-Amyl, (ZENPEP PO) Take by mouth. One tab with each meal for diarrhea    . potassium chloride SA (K-DUR,KLOR-CON) 20 MEQ tablet Take 1 tablet (20 mEq total) by mouth daily as needed (take any day you have to take a lasix (furosemide) dose). 30 tablet 0  . probenecid (BENEMID) 500 MG tablet Take 500 mg by mouth 2 (two) times daily.    . Probiotic CAPS Take 1 capsule by mouth daily.    Marland Kitchen. telmisartan (MICARDIS) 80 MG tablet Take 80 mg by mouth daily.    . vitamin B-12 (CYANOCOBALAMIN) 1000 MCG tablet Take 1,000 mcg by mouth daily. Monday through Friday     No current facility-administered medications for this visit.     Review of Systems  Constitutional:  Constitutional negative. HENT: HENT negative.  Eyes: Eyes negative.  Respiratory: Positive for shortness of breath and wheezing.  Cardiovascular: Positive for irregular heartbeat and leg swelling.  GI: Gastrointestinal negative.  Musculoskeletal: Musculoskeletal negative.  Skin: Skin negative.  Neurological: Neurological negative. Hematologic: Hematologic/lymphatic negative.  Psychiatric: Psychiatric negative.        Objective:  Objective   Vitals:   05/20/18 1138  BP: (!) 158/60  Pulse: 73  Resp: 20  Temp: (!) 97 F (36.1 C)  TempSrc: Oral  SpO2: 96%  Weight: 213 lb (96.6 kg)  Height: 5\' 3"  (1.6 m)   Body mass index is 37.73 kg/m.  Physical Exam  Constitutional: She  appears well-developed.  HENT:  Head: Normocephalic.  Eyes: Pupils are equal, round, and reactive to light.  Neck: Normal range of motion.  Cardiovascular: Normal rate.  Multiphasic peroneal signal on the left.  Pulmonary/Chest: Effort normal.  Abdominal: Soft.  Musculoskeletal: Normal range of motion. She exhibits edema.  Neurological: She is alert.  Skin: Skin is warm and dry.  Skin changes bilateral legs consistent with C4 venous disease.  Psychiatric: She has a normal mood and affect. Her behavior is normal. Judgment and thought content normal.    Data: I have independently interpreted her left lower extremity duplex which demonstrates 70% stenosis  at the proximal anastomosis with peak systolic velocity 333 and distally peak systolic velocity to 68 consistent with 50 to 70% stenosis.  ABIs on the right 0.78 and left 0.75.     Assessment/Plan:     84 year old female with history of him peroneal bypass on the left which is patent by duplex as well as physical exam with strong peroneal signal at the ankle.  There is evidence of a 70% stenosis proximally but there are no decreased velocities to suggest impending graft failure.  Given her multiple medical problems I would not put her back through angiography unless she were having active issues.  Her main issue at this time is leg swelling she has evidence of lymphedema as well as C4 venous disease although we do not have any documented evidence of venous insufficiency.  She is scheduled to get lymphedema pumps and I am in full favor of this given that her ABIs are significant enough for compression.  She can also wear compression as tolerated but she has difficulty with placing these.  I have urged her to walk as much as possible and elevate her legs when recumbent.  I will see her in 1 year with repeat left lower extremity duplex and ABIs.     Maeola Harman MD [facility]

## 2018-08-05 ENCOUNTER — Inpatient Hospital Stay
Admit: 2018-08-05 | Discharge: 2018-09-02 | Disposition: E | Payer: Medicare Other | Source: Ambulatory Visit | Attending: Internal Medicine | Admitting: Internal Medicine

## 2018-08-05 ENCOUNTER — Ambulatory Visit (HOSPITAL_COMMUNITY)
Admission: AD | Admit: 2018-08-05 | Discharge: 2018-08-05 | Disposition: A | Discharge status: Home / Self Care | Payer: Medicare Other | Source: Other Acute Inpatient Hospital | Attending: Internal Medicine | Admitting: Internal Medicine

## 2018-08-05 DIAGNOSIS — I5022 Chronic systolic (congestive) heart failure: Secondary | ICD-10-CM

## 2018-08-05 DIAGNOSIS — J90 Pleural effusion, not elsewhere classified: Secondary | ICD-10-CM

## 2018-08-05 DIAGNOSIS — J9600 Acute respiratory failure, unspecified whether with hypoxia or hypercapnia: Secondary | ICD-10-CM

## 2018-08-05 DIAGNOSIS — S82899S Other fracture of unspecified lower leg, sequela: Secondary | ICD-10-CM

## 2018-08-05 DIAGNOSIS — K3189 Other diseases of stomach and duodenum: Secondary | ICD-10-CM

## 2018-08-05 DIAGNOSIS — J9621 Acute and chronic respiratory failure with hypoxia: Secondary | ICD-10-CM

## 2018-08-05 DIAGNOSIS — J9690 Respiratory failure, unspecified, unspecified whether with hypoxia or hypercapnia: Secondary | ICD-10-CM | POA: Insufficient documentation

## 2018-08-05 DIAGNOSIS — Z9289 Personal history of other medical treatment: Secondary | ICD-10-CM

## 2018-08-05 DIAGNOSIS — J181 Lobar pneumonia, unspecified organism: Secondary | ICD-10-CM

## 2018-08-05 DIAGNOSIS — Z4659 Encounter for fitting and adjustment of other gastrointestinal appliance and device: Secondary | ICD-10-CM

## 2018-08-05 DIAGNOSIS — I480 Paroxysmal atrial fibrillation: Secondary | ICD-10-CM

## 2018-08-06 ENCOUNTER — Other Ambulatory Visit (HOSPITAL_COMMUNITY): Payer: Medicare Other

## 2018-08-06 DIAGNOSIS — J90 Pleural effusion, not elsewhere classified: Secondary | ICD-10-CM | POA: Diagnosis not present

## 2018-08-06 DIAGNOSIS — J9621 Acute and chronic respiratory failure with hypoxia: Secondary | ICD-10-CM

## 2018-08-06 DIAGNOSIS — I480 Paroxysmal atrial fibrillation: Secondary | ICD-10-CM | POA: Diagnosis not present

## 2018-08-06 DIAGNOSIS — J181 Lobar pneumonia, unspecified organism: Secondary | ICD-10-CM

## 2018-08-06 DIAGNOSIS — I5022 Chronic systolic (congestive) heart failure: Secondary | ICD-10-CM | POA: Diagnosis not present

## 2018-08-06 LAB — BLOOD GAS, ARTERIAL
Acid-Base Excess: 6.2 mmol/L — ABNORMAL HIGH (ref 0.0–2.0)
Bicarbonate: 29.6 mmol/L — ABNORMAL HIGH (ref 20.0–28.0)
FIO2: 50
MECHVT: 450 mL
O2 SAT: 93.6 %
PEEP: 8 cmH2O
PH ART: 7.501 — AB (ref 7.350–7.450)
Patient temperature: 98.6
RATE: 18 resp/min
pCO2 arterial: 38.2 mmHg (ref 32.0–48.0)
pO2, Arterial: 69.2 mmHg — ABNORMAL LOW (ref 83.0–108.0)

## 2018-08-06 LAB — COMPREHENSIVE METABOLIC PANEL
ALBUMIN: 2.5 g/dL — AB (ref 3.5–5.0)
ALK PHOS: 91 U/L (ref 38–126)
ALT: 20 U/L (ref 0–44)
ANION GAP: 12 (ref 5–15)
AST: 21 U/L (ref 15–41)
BUN: 35 mg/dL — AB (ref 8–23)
CO2: 27 mmol/L (ref 22–32)
Calcium: 9.9 mg/dL (ref 8.9–10.3)
Chloride: 101 mmol/L (ref 98–111)
Creatinine, Ser: 0.97 mg/dL (ref 0.44–1.00)
GFR calc Af Amer: >60 mL/min (ref >60)
GFR calc non Af Amer: 53 mL/min — ABNORMAL LOW (ref >60)
GLUCOSE: 136 mg/dL — AB (ref 70–99)
Potassium: 3.5 mmol/L (ref 3.5–5.1)
SODIUM: 140 mmol/L (ref 135–145)
Total Bilirubin: 0.3 mg/dL (ref 0.3–1.2)
Total Protein: 7.4 g/dL (ref 6.5–8.1)

## 2018-08-06 NOTE — Consult Note (Signed)
Pulmonary Critical Care Medicine [facility]  PULMONARY SERVICE  Date of Service: 08/06/2018  PULMONARY CRITICAL CARE CONSULT   Lauriana Denes  ZOX:[id number]  DOB: 1933/09/24   DOA: 08/08/2018  Referring Physician: Carron Curie, MD  HPI: Kelly Garner is a 84 y.o. female seen for follow up of Acute on Chronic Respiratory Failure.  Patient has multiple medical problems including history of coronary artery disease status post CABG back in 2016 she had a very complicated postoperative course.  Other medical history included congestive heart failure hypertension type 2 diabetes sacral decubitus ulcer chronic atrial fibrillation chronic asthma sleep apnea coronary artery disease.  Patient presented to the hospital with acute shortness of breath respiratory failure and at that time was found to have right-sided lobar pneumonia.  She apparently was also in acute congestive heart failure.  Further evaluation found that she had a urinary tract infection was treated with ertapenem for that.  Patient also was positive for MRSA.  Her hospital course was complicated by rapid atrial fibrillation she had mucous plugging underwent bronchoscopy for mucous plugging.  She had been initially on BiPAP and eventually ended up with a intubation.  She right now is on the vent orally intubated and full support  Review of Systems:  ROS performed and is unremarkable other than noted above.  Past Medical History:  Diagnosis Date  . Atrial fibrillation (HCC)   . Congestive heart failure (CHF) (HCC)   . Coronary artery disease   . Diarrhea 03/09/2018  . History of CVA (cerebrovascular accident)   . Peripheral vascular disease [facility])     Past Surgical History:  Procedure Laterality Date  . ABDOMINAL AORTOGRAM W/LOWER EXTREMITY N/A 12/23/2016   Procedure: Abdominal Aortogram w/Lower Extremity;  Surgeon: Maeola Harman, MD;  Location: [facility] INVASIVE CV LAB;  Service: Cardiovascular;  Laterality: N/A;   . CORONARY ARTERY BYPASS GRAFT     2000  . FEMORAL-POPLITEAL BYPASS GRAFT  10/04/15  . PERIPHERAL VASCULAR BALLOON ANGIOPLASTY  12/23/2016   Procedure: Peripheral Vascular Balloon Angioplasty;  Surgeon: Maeola Harman, MD;  Location: [facility] INVASIVE CV LAB;  Service: Cardiovascular;;    Social History:    reports that she has never smoked. She has never used smokeless tobacco. She reports that she does not drink alcohol or use drugs.  Family History: Non-Contributory to the present illness  Allergies  Allergen Reactions  . Ambien [Zolpidem Tartrate] Shortness Of Breath and Other (See Comments)    confusion  . Aspirin Swelling and Rash  . Codeine Rash  . Macrobid [employer] Macro] Other (See Comments)    Affects lungs for long term use, ok to use for short term purposes   . Other Rash    Nickel underneath watch caused redness to skin    Medications: Reviewed on Rounds  Physical Exam:  Vitals: Temperature 97.4 pulse 97 respiratory rate 26 blood pressure 131/84 saturation 95%  Ventilator Settings mode of ventilation assist control FiO2 50% tidal volume 579 PEEP of 8 patient is orally intubated  . General: Comfortable at this time . Eyes: Grossly normal lids, irises & conjunctiva . ENT: grossly tongue is normal . Neck: no obvious mass . Cardiovascular: S1-S2 normal no gallop or rubs noted . Respiratory: Coarse breath sounds no rhonchi . Abdomen: Soft and nontender . Skin: no rash seen on limited exam . Musculoskeletal: not rigid . Psychiatric:unable to assess . Neurologic: no seizure no involuntary movements         Labs on Admission:  Basic Metabolic Panel: Recent Labs  Lab 08/06/18 0700  NA 140  K 3.5  CL 101  CO2 27  GLUCOSE 136*  BUN 35*  CREATININE 0.97  CALCIUM 9.9    Recent Labs  Lab 08/06/18 0000  PHART 7.501*  PCO2ART 38.2  PO2ART 69.2*  HCO3 29.6*  O2SAT 93.6    Liver Function Tests: Recent Labs  Lab 08/06/18 0700   AST 21  ALT 20  ALKPHOS 91  BILITOT 0.3  PROT 7.4  ALBUMIN 2.5*   No results for input(s): LIPASE, AMYLASE in the last 168 hours. No results for input(s): AMMONIA in the last 168 hours.  CBC: No results for input(s): WBC, NEUTROABS, HGB, HCT, MCV, PLT in the last 168 hours.  Cardiac Enzymes: No results for input(s): CKTOTAL, CKMB, CKMBINDEX, TROPONINI in the last 168 hours.  BNP (last 3 results) No results for input(s): BNP in the last 8760 hours.  ProBNP (last 3 results) No results for input(s): PROBNP in the last 8760 hours.   Radiological Exams on Admission: Dg Abd 1 View  Result Date: 08/06/2018 CLINICAL DATA:  Orogastric tube placement. Gastric distention. EXAM: ABDOMEN - 1 VIEW COMPARISON:  Earlier today. FINDINGS: Orogastric tube tip in the distal stomach and side hole in the mid stomach. Normal bowel gas pattern. Left sacral neural stimulator lead. Lumbar and lower thoracic spine degenerative changes. Atheromatous arterial calcifications. IMPRESSION: Orogastric tube tip in the distal stomach and side hole in the mid stomach. Electronically Signed   By: Beckie Salts M.D.   On: 08/06/2018 10:58   Dg Chest Port 1 View  Result Date: 08/06/2018 CLINICAL DATA:  84 year old female intubated, enteric tube in place. EXAM: PORTABLE CHEST 1 VIEW COMPARISON:  Portable chest x-ray 10/12/2015. FINDINGS: Portable AP semi upright view at 1030 hours. Endotracheal tube tip in place, terminates about 1 centimeter above the carina. Enteric tube courses to the abdomen and the side hole projects over the gastric body air. Lung volumes and cardiomegaly appear stable since 2016. Prior CABG. Stable mediastinal contours. Right PICC line type catheter in place similar to the 2016 exam. No pneumothorax or pulmonary edema. Confluent left retrocardiac opacity with blunting of the left costophrenic angle and obscured left hemidiaphragm. The right lung appears clear allowing for portable technique. Negative  visible bowel gas pattern. IMPRESSION: 1. ETT tip about 10 millimeters above the carina. Enteric tube courses to the abdomen and side hole projects at the gastric body level. 2. Left lower lobe collapse or consolidation with small left pleural effusion. 3. Chronic cardiomegaly. Electronically Signed   By: Odessa Fleming M.D.   On: 08/06/2018 10:59   Dg Abd Portable 1v  Result Date: 08/06/2018 CLINICAL DATA:  Nasogastric tube placement. EXAM: PORTABLE ABDOMEN - 1 VIEW COMPARISON:  Abdomen and pelvis CT dated 10/21/2016 FINDINGS: Nasogastric tube tip in the region of the gastric pylorus or duodenal bulb. The included portion of the bowel gas pattern is normal. Lumbar and lower thoracic spine degenerative changes. Enlarged cardiac silhouette. Prominent interstitial markings with minimal bilateral pleural fluid. Left lower lobe airspace opacity. IMPRESSION: 1. Nasogastric tube tip in the region of the gastric pylorus or duodenal bulb. 2. Left lower lobe atelectasis or pneumonia. 3. Cardiomegaly and mild changes of congestive heart failure. Electronically Signed   By: Beckie Salts M.D.   On: 08/06/2018 07:05    Assessment/Plan Principal Problem:   Acute on chronic respiratory failure with hypoxia (HCC) Active Problems:   AF (paroxysmal atrial fibrillation) (HCC)  Recurrent left pleural effusion   Chronic systolic CHF (congestive heart failure) (HCC)   1. Acute on chronic respiratory failure with hypoxia at this time patient is on the ventilator orally intubated.  We will try to see about weaning the rate down and try on pressure support mode.  She was intubated approximately 2 days ago.  Review of the chest x-ray shows a left lobe pneumonia with some mild effusion we might need to look at this closely with CT to further assess the chest.  She also has significant cardiomegaly not known with her recent ejection fraction was. 2. Left lower lobe atelectasis versus pneumonia as above she also probably has  significant effusion noted on the chest film.  Would consider getting a CT of the chest. 3. Paroxysmal atrial fibrillation right now her rate is controlled she has been a history of rapid atrial fibrillation we will continue to monitor her closely. 4. Recurrent left-sided pleural effusion as above recommended getting a CT scan of the chest to further evaluate. 5. Chronic systolic heart failure we will monitor fluid status closely diuresis as tolerated.  Consider an echocardiogram to reassess the ejection fraction  I have personally seen and evaluated the patient, evaluated laboratory and imaging results, formulated the assessment and plan and placed orders. The Patient requires high complexity decision making for assessment and support.  Case was discussed on Rounds with the Respiratory Therapy Staff Time Spent  Yevonne Pax, MD [facility] Pulmonary Critical Care Medicine Sleep Medicine

## 2018-08-07 DIAGNOSIS — J90 Pleural effusion, not elsewhere classified: Secondary | ICD-10-CM

## 2018-08-07 DIAGNOSIS — J9621 Acute and chronic respiratory failure with hypoxia: Secondary | ICD-10-CM

## 2018-08-07 DIAGNOSIS — I5022 Chronic systolic (congestive) heart failure: Secondary | ICD-10-CM

## 2018-08-07 DIAGNOSIS — I480 Paroxysmal atrial fibrillation: Secondary | ICD-10-CM | POA: Diagnosis not present

## 2018-08-07 LAB — CBC
HCT: 30.4 % — ABNORMAL LOW (ref 36.0–46.0)
Hemoglobin: 9.7 g/dL — ABNORMAL LOW (ref 12.0–15.0)
MCH: 30.1 pg (ref 26.0–34.0)
MCHC: 31.9 g/dL (ref 30.0–36.0)
MCV: 94.4 fL (ref 78.0–100.0)
PLATELETS: 254 10*3/uL (ref 150–400)
RBC: 3.22 MIL/uL — ABNORMAL LOW (ref 3.87–5.11)
RDW: 13.6 % (ref 11.5–15.5)
WBC: 13.4 10*3/uL — ABNORMAL HIGH (ref 4.0–10.5)

## 2018-08-07 LAB — TSH: TSH: 1.115 u[IU]/mL (ref 0.350–4.500)

## 2018-08-07 NOTE — Progress Notes (Signed)
Pulmonary Critical Care Medicine [facility] [place]   PULMONARY CRITICAL CARE SERVICE  PROGRESS NOTE  Date of Service: 08/07/2018  Kelly Garner  VWU:[id number]  DOB: 03/15/1933   DOA: 08/15/2018  Referring Physician: Carron Curie, MD  HPI: Kelly Garner is a 84 y.o. female seen for follow up of Acute on Chronic Respiratory Failure.  Patient remains on assist control at this time.  She has endotracheal tube in place.  Been trying to wean her oxygen down and also to wean the PEEP down.  PEEP was decreased down to 5 FiO2 is now at 45%.  The patient still is somewhat sedated so has been having apneic episodes.  Discussed with primary care team to try to decrease the sedation level at this time.  She remains critically ill and has high risk airway  Medications: Reviewed on Rounds  Physical Exam:  Vitals: Temperature 98.3 pulse 120 respiratory rate 25 blood pressure 160/85 saturation 97%  Ventilator Settings mode of ventilation assist control FiO2 45% tidal volume 471 PEEP 5  . General: Comfortable at this time . Eyes: Grossly normal lids, irises & conjunctiva . ENT: grossly tongue is normal . Neck: no obvious mass . Cardiovascular: S1 S2 normal no gallop . Respiratory: Scattered rhonchi are noted at this time . Abdomen: soft . Skin: no rash seen on limited exam . Musculoskeletal: not rigid . Psychiatric:unable to assess . Neurologic: no seizure no involuntary movements         Lab Data:   Basic Metabolic Panel: Recent Labs  Lab 08/06/18 0700  NA 140  K 3.5  CL 101  CO2 27  GLUCOSE 136*  BUN 35*  CREATININE 0.97  CALCIUM 9.9    ABG: Recent Labs  Lab 08/06/18 0000  PHART 7.501*  PCO2ART 38.2  PO2ART 69.2*  HCO3 29.6*  O2SAT 93.6    Liver Function Tests: Recent Labs  Lab 08/06/18 0700  AST 21  ALT 20  ALKPHOS 91  BILITOT 0.3  PROT 7.4  ALBUMIN 2.5*   No results for input(s): LIPASE, AMYLASE in the last 168 hours. No results for input(s):  AMMONIA in the last 168 hours.  CBC: Recent Labs  Lab 08/07/18 0743  WBC 13.4*  HGB 9.7*  HCT 30.4*  MCV 94.4  PLT 254    Cardiac Enzymes: No results for input(s): CKTOTAL, CKMB, CKMBINDEX, TROPONINI in the last 168 hours.  BNP (last 3 results) No results for input(s): BNP in the last 8760 hours.  ProBNP (last 3 results) No results for input(s): PROBNP in the last 8760 hours.  Radiological Exams: Dg Abd 1 View  Result Date: 08/06/2018 CLINICAL DATA:  Orogastric tube placement. Gastric distention. EXAM: ABDOMEN - 1 VIEW COMPARISON:  Earlier today. FINDINGS: Orogastric tube tip in the distal stomach and side hole in the mid stomach. Normal bowel gas pattern. Left sacral neural stimulator lead. Lumbar and lower thoracic spine degenerative changes. Atheromatous arterial calcifications. IMPRESSION: Orogastric tube tip in the distal stomach and side hole in the mid stomach. Electronically Signed   By: Beckie Salts M.D.   On: 08/06/2018 10:58   Dg Chest Port 1 View  Result Date: 08/06/2018 CLINICAL DATA:  84 year old female intubated, enteric tube in place. EXAM: PORTABLE CHEST 1 VIEW COMPARISON:  Portable chest x-ray 10/12/2015. FINDINGS: Portable AP semi upright view at 1030 hours. Endotracheal tube tip in place, terminates about 1 centimeter above the carina. Enteric tube courses to the abdomen and the side hole projects over the gastric body  air. Lung volumes and cardiomegaly appear stable since 2016. Prior CABG. Stable mediastinal contours. Right PICC line type catheter in place similar to the 2016 exam. No pneumothorax or pulmonary edema. Confluent left retrocardiac opacity with blunting of the left costophrenic angle and obscured left hemidiaphragm. The right lung appears clear allowing for portable technique. Negative visible bowel gas pattern. IMPRESSION: 1. ETT tip about 10 millimeters above the carina. Enteric tube courses to the abdomen and side hole projects at the gastric body  level. 2. Left lower lobe collapse or consolidation with small left pleural effusion. 3. Chronic cardiomegaly. Electronically Signed   By: Odessa Fleming M.D.   On: 08/06/2018 10:59   Dg Abd Portable 1v  Result Date: 08/06/2018 CLINICAL DATA:  Nasogastric tube placement. EXAM: PORTABLE ABDOMEN - 1 VIEW COMPARISON:  Abdomen and pelvis CT dated 10/21/2016 FINDINGS: Nasogastric tube tip in the region of the gastric pylorus or duodenal bulb. The included portion of the bowel gas pattern is normal. Lumbar and lower thoracic spine degenerative changes. Enlarged cardiac silhouette. Prominent interstitial markings with minimal bilateral pleural fluid. Left lower lobe airspace opacity. IMPRESSION: 1. Nasogastric tube tip in the region of the gastric pylorus or duodenal bulb. 2. Left lower lobe atelectasis or pneumonia. 3. Cardiomegaly and mild changes of congestive heart failure. Electronically Signed   By: Beckie Salts M.D.   On: 08/06/2018 07:05    Assessment/Plan Principal Problem:   Acute on chronic respiratory failure with hypoxia (HCC) Active Problems:   AF (paroxysmal atrial fibrillation) (HCC)   Recurrent left pleural effusion   Chronic systolic CHF (congestive heart failure) (HCC)   1. Acute on chronic respiratory failure with hypoxia patient will be continued on full vent support she has been having periodic apnea so therefore is not able to go on a pressure support mode.  We will have respiratory continue to assess depending on her status he should be able to try pressure support wean 2. Chronic atrial fibrillation rate is controlled at this time we will continue with supportive care she does have periodic breakthrough tachycardia and will need to be monitored closely. 3. Recurrent left pleural effusion follow chest x-ray we will discuss with the primary care team regarding possibility of thoracentesis which may actually help with her weaning process. 4. Chronic systolic heart failure diuresis  tolerated monitor fluid status.   I have personally seen and evaluated the patient, evaluated laboratory and imaging results, formulated the assessment and plan and placed orders. The Patient requires high complexity decision making for assessment and support.  Case was discussed on Rounds with the Respiratory Therapy Staff time 35 minutes patient is critically ill in danger of cardiac arrest.  She also has a high risk airway is endotracheally intubated and requiring sedation  Yevonne Pax, MD [facility] Pulmonary Critical Care Medicine Sleep Medicine

## 2018-08-08 ENCOUNTER — Other Ambulatory Visit (HOSPITAL_COMMUNITY): Payer: Medicare Other

## 2018-08-08 DIAGNOSIS — I5022 Chronic systolic (congestive) heart failure: Secondary | ICD-10-CM | POA: Diagnosis not present

## 2018-08-08 DIAGNOSIS — J9621 Acute and chronic respiratory failure with hypoxia: Secondary | ICD-10-CM | POA: Diagnosis not present

## 2018-08-08 DIAGNOSIS — I480 Paroxysmal atrial fibrillation: Secondary | ICD-10-CM | POA: Diagnosis not present

## 2018-08-08 DIAGNOSIS — J90 Pleural effusion, not elsewhere classified: Secondary | ICD-10-CM | POA: Diagnosis not present

## 2018-08-08 MED ORDER — IOHEXOL 300 MG/ML  SOLN
100.0000 mL | Freq: Once | INTRAMUSCULAR | Status: AC | PRN
  Administered 2018-08-08: 100 mL via INTRAVENOUS

## 2018-08-08 NOTE — Progress Notes (Signed)
Pulmonary Critical Care Medicine [facility] [place]   PULMONARY CRITICAL CARE SERVICE  PROGRESS NOTE  Date of Service: 08/08/2018  Kelly Garner  ZOX:[id number]  DOB: 08-02-1933   DOA: 08/25/2018  Referring Physician: Carron Curie, MD  HPI: Kelly Garner is a 84 y.o. female seen for follow up of Acute on Chronic Respiratory Failure.  Patient remains on full vent support at this time is on assist control mode requiring about 40% oxygen.  Currently volumes are about 450 with a PEEP of 5  Medications: Reviewed on Rounds  Physical Exam:  Vitals: Temperature 97.4 pulse 95 respiratory rate 25 blood pressure 129/47 saturations 93%  Ventilator Settings mode of ventilation assist control FiO2 40% 455 patient has an endotracheal tube in place remains at high risk for dislodgment  . General: Comfortable at this time . Eyes: Grossly normal lids, irises & conjunctiva . ENT: grossly tongue is normal . Neck: no obvious mass . Cardiovascular: S1 S2 normal no gallop . Respiratory: Coarse rhonchi are noted bilaterally . Abdomen: soft . Skin: no rash seen on limited exam . Musculoskeletal: not rigid . Psychiatric:unable to assess . Neurologic: no seizure no involuntary movements         Lab Data:   Basic Metabolic Panel: Recent Labs  Lab 08/06/18 0700  NA 140  K 3.5  CL 101  CO2 27  GLUCOSE 136*  BUN 35*  CREATININE 0.97  CALCIUM 9.9    ABG: Recent Labs  Lab 08/06/18 0000  PHART 7.501*  PCO2ART 38.2  PO2ART 69.2*  HCO3 29.6*  O2SAT 93.6    Liver Function Tests: Recent Labs  Lab 08/06/18 0700  AST 21  ALT 20  ALKPHOS 91  BILITOT 0.3  PROT 7.4  ALBUMIN 2.5*   No results for input(s): LIPASE, AMYLASE in the last 168 hours. No results for input(s): AMMONIA in the last 168 hours.  CBC: Recent Labs  Lab 08/07/18 0743  WBC 13.4*  HGB 9.7*  HCT 30.4*  MCV 94.4  PLT 254    Cardiac Enzymes: No results for input(s): CKTOTAL, CKMB, CKMBINDEX,  TROPONINI in the last 168 hours.  BNP (last 3 results) No results for input(s): BNP in the last 8760 hours.  ProBNP (last 3 results) No results for input(s): PROBNP in the last 8760 hours.  Radiological Exams: Ct Chest W Contrast  Result Date: 08/08/2018 CLINICAL DATA:  Pt apparently swallowed a tooth cap that came off last night. Looking for foreign body but RN says it is not metal. Ordered A/P originally but when RN explained that they were worried due to her OG tube and endotrach tubes and wanted to make sure it was not in her lungs. EXAM: CT CHEST, ABDOMEN, AND PELVIS WITH CONTRAST TECHNIQUE: Multidetector CT imaging of the chest, abdomen and pelvis was performed following the standard protocol during bolus administration of intravenous contrast. CONTRAST:  OMNIPAQUE IOHEXOL 300 MG/ML  SOLN COMPARISON:  Chest radiographs, 08/06/2018.  CT, 10/21/2016. FINDINGS: CT CHEST FINDINGS Cardiovascular: Heart is mildly enlarged. Status post CABG surgery. No pericardial effusion. Three-vessel coronary artery calcifications. Great vessels normal in caliber. No aortic dissection. Aortic and branch vessel atherosclerosis. Mediastinum/Nodes: There is a density that projects along the right posterior aspect of the esophagus, at the level of the left atrium, which was not present on the prior CT. This is consistent with the ingested tooth. Endotracheal tube tip lies at the Carina. Nasal/orogastric tube passes below the diaphragm well into the stomach. No neck base or  axillary masses or adenopathy. No mediastinal or hilar masses. No pathologically enlarged lymph nodes. Lungs/Pleura: Minimal pleural effusions. There is bilateral lower lobe consolidation and/or atelectasis. Mild interstitial thickening noted in the lower lungs. No convincing pulmonary edema. No radiopaque foreign body in the lungs. No pneumothorax. Musculoskeletal: No fracture or acute finding. No osteoblastic or osteolytic lesions. CT ABDOMEN  PELVIS FINDINGS Hepatobiliary: No focal liver abnormality is seen. No gallstones, gallbladder wall thickening, or biliary dilatation. Pancreas: Unremarkable. No pancreatic ductal dilatation or surrounding inflammatory changes. Spleen: Normal in size without focal abnormality. Adrenals/Urinary Tract: No adrenal masses. Bilateral renal cortical thinning. 1 cm low-density lesion in the anterior midpole the left kidney, likely a cyst, similar to the prior CT angiogram. No other renal masses. No hydronephrosis. Normal ureters. Bladder is decompressed by Foley catheter. Stomach/Bowel: There is a focal density in the dependent aspect of the gastric fundus, measuring 8 mm. This may reflect an additional ingested tooth cap. Stomach is otherwise unremarkable. No bowel dilation, wall thickening or inflammation. There are sigmoid diverticula. Appendix not visualized. No evidence of appendicitis. Vascular/Lymphatic: Extensive aortic atherosclerosis. No aneurysm. No adenopathy. Reproductive: Status post hysterectomy. No adnexal masses. Other: No abdominal wall hernia or abnormality. No abdominopelvic ascites. Musculoskeletal: No fracture or acute finding. No osteoblastic or osteolytic lesions. IMPRESSION: 1. Radiopaque foreign body projects in the right posterior distal esophagus consistent with the history of an ingested tooth cap. There is another radiopaque foreign body in the posterior stomach, which may reflect an additional ingested tooth cap. 2. Bilateral lower lobe opacity, most likely atelectasis. Consider pneumonia if there are consistent clinical findings. This is greater on the left. 3. Minimal bilateral pleural effusions. 4. No acute abnormalities in the abdomen or pelvis. Electronically Signed   By: Amie Portland M.D.   On: 08/08/2018 17:01   Ct Abdomen Pelvis W Contrast  Result Date: 08/08/2018 CLINICAL DATA:  Pt apparently swallowed a tooth cap that came off last night. Looking for foreign body but RN says it  is not metal. Ordered A/P originally but when RN explained that they were worried due to her OG tube and endotrach tubes and wanted to make sure it was not in her lungs. EXAM: CT CHEST, ABDOMEN, AND PELVIS WITH CONTRAST TECHNIQUE: Multidetector CT imaging of the chest, abdomen and pelvis was performed following the standard protocol during bolus administration of intravenous contrast. CONTRAST:  OMNIPAQUE IOHEXOL 300 MG/ML  SOLN COMPARISON:  Chest radiographs, 08/06/2018.  CT, 10/21/2016. FINDINGS: CT CHEST FINDINGS Cardiovascular: Heart is mildly enlarged. Status post CABG surgery. No pericardial effusion. Three-vessel coronary artery calcifications. Great vessels normal in caliber. No aortic dissection. Aortic and branch vessel atherosclerosis. Mediastinum/Nodes: There is a density that projects along the right posterior aspect of the esophagus, at the level of the left atrium, which was not present on the prior CT. This is consistent with the ingested tooth. Endotracheal tube tip lies at the Carina. Nasal/orogastric tube passes below the diaphragm well into the stomach. No neck base or axillary masses or adenopathy. No mediastinal or hilar masses. No pathologically enlarged lymph nodes. Lungs/Pleura: Minimal pleural effusions. There is bilateral lower lobe consolidation and/or atelectasis. Mild interstitial thickening noted in the lower lungs. No convincing pulmonary edema. No radiopaque foreign body in the lungs. No pneumothorax. Musculoskeletal: No fracture or acute finding. No osteoblastic or osteolytic lesions. CT ABDOMEN PELVIS FINDINGS Hepatobiliary: No focal liver abnormality is seen. No gallstones, gallbladder wall thickening, or biliary dilatation. Pancreas: Unremarkable. No pancreatic ductal dilatation or surrounding  inflammatory changes. Spleen: Normal in size without focal abnormality. Adrenals/Urinary Tract: No adrenal masses. Bilateral renal cortical thinning. 1 cm low-density lesion in the  anterior midpole the left kidney, likely a cyst, similar to the prior CT angiogram. No other renal masses. No hydronephrosis. Normal ureters. Bladder is decompressed by Foley catheter. Stomach/Bowel: There is a focal density in the dependent aspect of the gastric fundus, measuring 8 mm. This may reflect an additional ingested tooth cap. Stomach is otherwise unremarkable. No bowel dilation, wall thickening or inflammation. There are sigmoid diverticula. Appendix not visualized. No evidence of appendicitis. Vascular/Lymphatic: Extensive aortic atherosclerosis. No aneurysm. No adenopathy. Reproductive: Status post hysterectomy. No adnexal masses. Other: No abdominal wall hernia or abnormality. No abdominopelvic ascites. Musculoskeletal: No fracture or acute finding. No osteoblastic or osteolytic lesions. IMPRESSION: 1. Radiopaque foreign body projects in the right posterior distal esophagus consistent with the history of an ingested tooth cap. There is another radiopaque foreign body in the posterior stomach, which may reflect an additional ingested tooth cap. 2. Bilateral lower lobe opacity, most likely atelectasis. Consider pneumonia if there are consistent clinical findings. This is greater on the left. 3. Minimal bilateral pleural effusions. 4. No acute abnormalities in the abdomen or pelvis. Electronically Signed   By: Amie Portland M.D.   On: 08/08/2018 17:01    Assessment/Plan Principal Problem:   Acute on chronic respiratory failure with hypoxia (HCC) Active Problems:   AF (paroxysmal atrial fibrillation) (HCC)   Recurrent left pleural effusion   Chronic systolic CHF (congestive heart failure) (HCC)   1. Acute on chronic respiratory failure with hypoxia we will continue with full vent support.  Patient has not been tolerating weaning attempts.  May end up needing to have a tracheostomy we will continue to assess daily. 2. Chronic atrial fibrillation paroxysmal atrial fibrillation right now the rate  is controlled we will continue to monitor. 3. Recurrent left pleural effusion follow x-rays as needed. 4. Chronic systolic heart failure at baseline we will continue with present therapy monitor fluid status   I have personally seen and evaluated the patient, evaluated laboratory and imaging results, formulated the assessment and plan and placed orders. The Patient requires high complexity decision making for assessment and support.  Case was discussed on Rounds with the Respiratory Therapy Staff  Yevonne Pax, MD [facility] Pulmonary Critical Care Medicine Sleep Medicine

## 2018-08-09 DIAGNOSIS — J90 Pleural effusion, not elsewhere classified: Secondary | ICD-10-CM | POA: Diagnosis not present

## 2018-08-09 DIAGNOSIS — J9621 Acute and chronic respiratory failure with hypoxia: Secondary | ICD-10-CM | POA: Diagnosis not present

## 2018-08-09 DIAGNOSIS — I480 Paroxysmal atrial fibrillation: Secondary | ICD-10-CM | POA: Diagnosis not present

## 2018-08-09 DIAGNOSIS — I5022 Chronic systolic (congestive) heart failure: Secondary | ICD-10-CM | POA: Diagnosis not present

## 2018-08-09 NOTE — Progress Notes (Signed)
Pulmonary Critical Care Medicine [facility] [place]   PULMONARY CRITICAL CARE SERVICE  PROGRESS NOTE  Date of Service: 08/09/2018  Kelly Garner  GNF:[id number]  DOB: 03-Jan-1933   DOA: 2018/08/12  Referring Physician: Carron Curie, MD  HPI: Kelly Garner is a 84 y.o. female seen for follow up of Acute on Chronic Respiratory Failure.  Right now is on full vent support.  She is orally intubated.  Spoke with the patient she is willing to have a tracheostomy done.  She has not been tolerating weaning very well  Medications: Reviewed on Rounds  Physical Exam:  Vitals: Temperature 97.5 pulse 98 respiratory rate 17 blood pressure 172/94 saturation 96%  Ventilator Settings mode of ventilation assist control FiO2 40% tidal volume 549 PEEP 5  . General: Comfortable at this time . Eyes: Grossly normal lids, irises & conjunctiva . ENT: grossly tongue is normal . Neck: no obvious mass . Cardiovascular: S1 S2 normal no gallop . Respiratory: No rhonchi or rales are noted at this time . Abdomen: soft . Skin: no rash seen on limited exam . Musculoskeletal: not rigid . Psychiatric:unable to assess . Neurologic: no seizure no involuntary movements         Lab Data:   Basic Metabolic Panel: Recent Labs  Lab 08/06/18 0700  NA 140  K 3.5  CL 101  CO2 27  GLUCOSE 136*  BUN 35*  CREATININE 0.97  CALCIUM 9.9    ABG: Recent Labs  Lab 08/06/18 0000  PHART 7.501*  PCO2ART 38.2  PO2ART 69.2*  HCO3 29.6*  O2SAT 93.6    Liver Function Tests: Recent Labs  Lab 08/06/18 0700  AST 21  ALT 20  ALKPHOS 91  BILITOT 0.3  PROT 7.4  ALBUMIN 2.5*   No results for input(s): LIPASE, AMYLASE in the last 168 hours. No results for input(s): AMMONIA in the last 168 hours.  CBC: Recent Labs  Lab 08/07/18 0743  WBC 13.4*  HGB 9.7*  HCT 30.4*  MCV 94.4  PLT 254    Cardiac Enzymes: No results for input(s): CKTOTAL, CKMB, CKMBINDEX, TROPONINI in the last 168 hours.  BNP  (last 3 results) No results for input(s): BNP in the last 8760 hours.  ProBNP (last 3 results) No results for input(s): PROBNP in the last 8760 hours.  Radiological Exams: Ct Chest W Contrast  Result Date: 08/08/2018 CLINICAL DATA:  Pt apparently swallowed a tooth cap that came off last night. Looking for foreign body but RN says it is not metal. Ordered A/P originally but when RN explained that they were worried due to her OG tube and endotrach tubes and wanted to make sure it was not in her lungs. EXAM: CT CHEST, ABDOMEN, AND PELVIS WITH CONTRAST TECHNIQUE: Multidetector CT imaging of the chest, abdomen and pelvis was performed following the standard protocol during bolus administration of intravenous contrast. CONTRAST:  OMNIPAQUE IOHEXOL 300 MG/ML  SOLN COMPARISON:  Chest radiographs, 08/06/2018.  CT, 10/21/2016. FINDINGS: CT CHEST FINDINGS Cardiovascular: Heart is mildly enlarged. Status post CABG surgery. No pericardial effusion. Three-vessel coronary artery calcifications. Great vessels normal in caliber. No aortic dissection. Aortic and branch vessel atherosclerosis. Mediastinum/Nodes: There is a density that projects along the right posterior aspect of the esophagus, at the level of the left atrium, which was not present on the prior CT. This is consistent with the ingested tooth. Endotracheal tube tip lies at the Carina. Nasal/orogastric tube passes below the diaphragm well into the stomach. No neck base or  axillary masses or adenopathy. No mediastinal or hilar masses. No pathologically enlarged lymph nodes. Lungs/Pleura: Minimal pleural effusions. There is bilateral lower lobe consolidation and/or atelectasis. Mild interstitial thickening noted in the lower lungs. No convincing pulmonary edema. No radiopaque foreign body in the lungs. No pneumothorax. Musculoskeletal: No fracture or acute finding. No osteoblastic or osteolytic lesions. CT ABDOMEN PELVIS FINDINGS Hepatobiliary: No focal  liver abnormality is seen. No gallstones, gallbladder wall thickening, or biliary dilatation. Pancreas: Unremarkable. No pancreatic ductal dilatation or surrounding inflammatory changes. Spleen: Normal in size without focal abnormality. Adrenals/Urinary Tract: No adrenal masses. Bilateral renal cortical thinning. 1 cm low-density lesion in the anterior midpole the left kidney, likely a cyst, similar to the prior CT angiogram. No other renal masses. No hydronephrosis. Normal ureters. Bladder is decompressed by Foley catheter. Stomach/Bowel: There is a focal density in the dependent aspect of the gastric fundus, measuring 8 mm. This may reflect an additional ingested tooth cap. Stomach is otherwise unremarkable. No bowel dilation, wall thickening or inflammation. There are sigmoid diverticula. Appendix not visualized. No evidence of appendicitis. Vascular/Lymphatic: Extensive aortic atherosclerosis. No aneurysm. No adenopathy. Reproductive: Status post hysterectomy. No adnexal masses. Other: No abdominal wall hernia or abnormality. No abdominopelvic ascites. Musculoskeletal: No fracture or acute finding. No osteoblastic or osteolytic lesions. IMPRESSION: 1. Radiopaque foreign body projects in the right posterior distal esophagus consistent with the history of an ingested tooth cap. There is another radiopaque foreign body in the posterior stomach, which may reflect an additional ingested tooth cap. 2. Bilateral lower lobe opacity, most likely atelectasis. Consider pneumonia if there are consistent clinical findings. This is greater on the left. 3. Minimal bilateral pleural effusions. 4. No acute abnormalities in the abdomen or pelvis. Electronically Signed   By: Amie Portland M.D.   On: 08/08/2018 17:01   Ct Abdomen Pelvis W Contrast  Result Date: 08/08/2018 CLINICAL DATA:  Pt apparently swallowed a tooth cap that came off last night. Looking for foreign body but RN says it is not metal. Ordered A/P originally but  when RN explained that they were worried due to her OG tube and endotrach tubes and wanted to make sure it was not in her lungs. EXAM: CT CHEST, ABDOMEN, AND PELVIS WITH CONTRAST TECHNIQUE: Multidetector CT imaging of the chest, abdomen and pelvis was performed following the standard protocol during bolus administration of intravenous contrast. CONTRAST:  OMNIPAQUE IOHEXOL 300 MG/ML  SOLN COMPARISON:  Chest radiographs, 08/06/2018.  CT, 10/21/2016. FINDINGS: CT CHEST FINDINGS Cardiovascular: Heart is mildly enlarged. Status post CABG surgery. No pericardial effusion. Three-vessel coronary artery calcifications. Great vessels normal in caliber. No aortic dissection. Aortic and branch vessel atherosclerosis. Mediastinum/Nodes: There is a density that projects along the right posterior aspect of the esophagus, at the level of the left atrium, which was not present on the prior CT. This is consistent with the ingested tooth. Endotracheal tube tip lies at the Carina. Nasal/orogastric tube passes below the diaphragm well into the stomach. No neck base or axillary masses or adenopathy. No mediastinal or hilar masses. No pathologically enlarged lymph nodes. Lungs/Pleura: Minimal pleural effusions. There is bilateral lower lobe consolidation and/or atelectasis. Mild interstitial thickening noted in the lower lungs. No convincing pulmonary edema. No radiopaque foreign body in the lungs. No pneumothorax. Musculoskeletal: No fracture or acute finding. No osteoblastic or osteolytic lesions. CT ABDOMEN PELVIS FINDINGS Hepatobiliary: No focal liver abnormality is seen. No gallstones, gallbladder wall thickening, or biliary dilatation. Pancreas: Unremarkable. No pancreatic ductal dilatation or surrounding  inflammatory changes. Spleen: Normal in size without focal abnormality. Adrenals/Urinary Tract: No adrenal masses. Bilateral renal cortical thinning. 1 cm low-density lesion in the anterior midpole the left kidney, likely a  cyst, similar to the prior CT angiogram. No other renal masses. No hydronephrosis. Normal ureters. Bladder is decompressed by Foley catheter. Stomach/Bowel: There is a focal density in the dependent aspect of the gastric fundus, measuring 8 mm. This may reflect an additional ingested tooth cap. Stomach is otherwise unremarkable. No bowel dilation, wall thickening or inflammation. There are sigmoid diverticula. Appendix not visualized. No evidence of appendicitis. Vascular/Lymphatic: Extensive aortic atherosclerosis. No aneurysm. No adenopathy. Reproductive: Status post hysterectomy. No adnexal masses. Other: No abdominal wall hernia or abnormality. No abdominopelvic ascites. Musculoskeletal: No fracture or acute finding. No osteoblastic or osteolytic lesions. IMPRESSION: 1. Radiopaque foreign body projects in the right posterior distal esophagus consistent with the history of an ingested tooth cap. There is another radiopaque foreign body in the posterior stomach, which may reflect an additional ingested tooth cap. 2. Bilateral lower lobe opacity, most likely atelectasis. Consider pneumonia if there are consistent clinical findings. This is greater on the left. 3. Minimal bilateral pleural effusions. 4. No acute abnormalities in the abdomen or pelvis. Electronically Signed   By: Amie Portland M.D.   On: 08/08/2018 17:01    Assessment/Plan Principal Problem:   Acute on chronic respiratory failure with hypoxia (HCC) Active Problems:   AF (paroxysmal atrial fibrillation) (HCC)   Recurrent left pleural effusion   Chronic systolic CHF (congestive heart failure) (HCC)   1. Acute on chronic respiratory failure with hypoxia continue with full vent support right now is on assist control patient is on 40% FiO2 with a PEEP of 5 tidal volume 549 the consultation for tracheostomy has been placed with ENT 2. Chronic atrial fibrillation rate is controlled we will continue to monitor. 3. Recurrent pleural effusion  stable 4. Chronic systolic heart failure at baseline we will continue to follow   I have personally seen and evaluated the patient, evaluated laboratory and imaging results, formulated the assessment and plan and placed orders. The Patient requires high complexity decision making for assessment and support.  Case was discussed on Rounds with the Respiratory Therapy Staff  Yevonne Pax, MD [facility] Pulmonary Critical Care Medicine Sleep Medicine

## 2018-08-10 DIAGNOSIS — I5022 Chronic systolic (congestive) heart failure: Secondary | ICD-10-CM | POA: Diagnosis not present

## 2018-08-10 DIAGNOSIS — J9621 Acute and chronic respiratory failure with hypoxia: Secondary | ICD-10-CM | POA: Diagnosis not present

## 2018-08-10 DIAGNOSIS — I480 Paroxysmal atrial fibrillation: Secondary | ICD-10-CM | POA: Diagnosis not present

## 2018-08-10 DIAGNOSIS — J90 Pleural effusion, not elsewhere classified: Secondary | ICD-10-CM | POA: Diagnosis not present

## 2018-08-10 NOTE — Progress Notes (Signed)
Pulmonary Critical Care Medicine [facility] [place]   PULMONARY CRITICAL CARE SERVICE  PROGRESS NOTE  Date of Service: 08/10/2018  Kelly Garner  ZHY:[id number]  DOB: June 17, 1933   DOA: 08/02/2018  Referring Physician: Carron Curie, MD  HPI: Kelly Garner is a 84 y.o. female seen for follow up of Acute on Chronic Respiratory Failure.  Patient is on pressure support wean trying to wean her today for about 8 hours  Medications: Reviewed on Rounds  Physical Exam:  Vitals: Temperature 97.8 pulse 82 respiratory 22 blood pressure 1 7474 saturations 97%  Ventilator Settings currently is on pressure support FiO2 40% tidal volume is 400 pressure support 12 PEEP 5  . General: Comfortable at this time . Eyes: Grossly normal lids, irises & conjunctiva . ENT: grossly tongue is normal . Neck: no obvious mass . Cardiovascular: S1 S2 normal no gallop . Respiratory: No rhonchi . Abdomen: soft . Skin: no rash seen on limited exam . Musculoskeletal: not rigid . Psychiatric:unable to assess . Neurologic: no seizure no involuntary movements         Lab Data:   Basic Metabolic Panel: Recent Labs  Lab 08/06/18 0700  NA 140  K 3.5  CL 101  CO2 27  GLUCOSE 136*  BUN 35*  CREATININE 0.97  CALCIUM 9.9    ABG: Recent Labs  Lab 08/06/18 0000  PHART 7.501*  PCO2ART 38.2  PO2ART 69.2*  HCO3 29.6*  O2SAT 93.6    Liver Function Tests: Recent Labs  Lab 08/06/18 0700  AST 21  ALT 20  ALKPHOS 91  BILITOT 0.3  PROT 7.4  ALBUMIN 2.5*   No results for input(s): LIPASE, AMYLASE in the last 168 hours. No results for input(s): AMMONIA in the last 168 hours.  CBC: Recent Labs  Lab 08/07/18 0743  WBC 13.4*  HGB 9.7*  HCT 30.4*  MCV 94.4  PLT 254    Cardiac Enzymes: No results for input(s): CKTOTAL, CKMB, CKMBINDEX, TROPONINI in the last 168 hours.  BNP (last 3 results) No results for input(s): BNP in the last 8760 hours.  ProBNP (last 3 results) No results for  input(s): PROBNP in the last 8760 hours.  Radiological Exams: Ct Chest W Contrast  Result Date: 08/08/2018 CLINICAL DATA:  Pt apparently swallowed a tooth cap that came off last night. Looking for foreign body but RN says it is not metal. Ordered A/P originally but when RN explained that they were worried due to her OG tube and endotrach tubes and wanted to make sure it was not in her lungs. EXAM: CT CHEST, ABDOMEN, AND PELVIS WITH CONTRAST TECHNIQUE: Multidetector CT imaging of the chest, abdomen and pelvis was performed following the standard protocol during bolus administration of intravenous contrast. CONTRAST:  OMNIPAQUE IOHEXOL 300 MG/ML  SOLN COMPARISON:  Chest radiographs, 08/06/2018.  CT, 10/21/2016. FINDINGS: CT CHEST FINDINGS Cardiovascular: Heart is mildly enlarged. Status post CABG surgery. No pericardial effusion. Three-vessel coronary artery calcifications. Great vessels normal in caliber. No aortic dissection. Aortic and branch vessel atherosclerosis. Mediastinum/Nodes: There is a density that projects along the right posterior aspect of the esophagus, at the level of the left atrium, which was not present on the prior CT. This is consistent with the ingested tooth. Endotracheal tube tip lies at the Carina. Nasal/orogastric tube passes below the diaphragm well into the stomach. No neck base or axillary masses or adenopathy. No mediastinal or hilar masses. No pathologically enlarged lymph nodes. Lungs/Pleura: Minimal pleural effusions. There is bilateral lower  lobe consolidation and/or atelectasis. Mild interstitial thickening noted in the lower lungs. No convincing pulmonary edema. No radiopaque foreign body in the lungs. No pneumothorax. Musculoskeletal: No fracture or acute finding. No osteoblastic or osteolytic lesions. CT ABDOMEN PELVIS FINDINGS Hepatobiliary: No focal liver abnormality is seen. No gallstones, gallbladder wall thickening, or biliary dilatation. Pancreas: Unremarkable.  No pancreatic ductal dilatation or surrounding inflammatory changes. Spleen: Normal in size without focal abnormality. Adrenals/Urinary Tract: No adrenal masses. Bilateral renal cortical thinning. 1 cm low-density lesion in the anterior midpole the left kidney, likely a cyst, similar to the prior CT angiogram. No other renal masses. No hydronephrosis. Normal ureters. Bladder is decompressed by Foley catheter. Stomach/Bowel: There is a focal density in the dependent aspect of the gastric fundus, measuring 8 mm. This may reflect an additional ingested tooth cap. Stomach is otherwise unremarkable. No bowel dilation, wall thickening or inflammation. There are sigmoid diverticula. Appendix not visualized. No evidence of appendicitis. Vascular/Lymphatic: Extensive aortic atherosclerosis. No aneurysm. No adenopathy. Reproductive: Status post hysterectomy. No adnexal masses. Other: No abdominal wall hernia or abnormality. No abdominopelvic ascites. Musculoskeletal: No fracture or acute finding. No osteoblastic or osteolytic lesions. IMPRESSION: 1. Radiopaque foreign body projects in the right posterior distal esophagus consistent with the history of an ingested tooth cap. There is another radiopaque foreign body in the posterior stomach, which may reflect an additional ingested tooth cap. 2. Bilateral lower lobe opacity, most likely atelectasis. Consider pneumonia if there are consistent clinical findings. This is greater on the left. 3. Minimal bilateral pleural effusions. 4. No acute abnormalities in the abdomen or pelvis. Electronically Signed   By: Amie Portland M.D.   On: 08/08/2018 17:01   Ct Abdomen Pelvis W Contrast  Result Date: 08/08/2018 CLINICAL DATA:  Pt apparently swallowed a tooth cap that came off last night. Looking for foreign body but RN says it is not metal. Ordered A/P originally but when RN explained that they were worried due to her OG tube and endotrach tubes and wanted to make sure it was not in  her lungs. EXAM: CT CHEST, ABDOMEN, AND PELVIS WITH CONTRAST TECHNIQUE: Multidetector CT imaging of the chest, abdomen and pelvis was performed following the standard protocol during bolus administration of intravenous contrast. CONTRAST:  OMNIPAQUE IOHEXOL 300 MG/ML  SOLN COMPARISON:  Chest radiographs, 08/06/2018.  CT, 10/21/2016. FINDINGS: CT CHEST FINDINGS Cardiovascular: Heart is mildly enlarged. Status post CABG surgery. No pericardial effusion. Three-vessel coronary artery calcifications. Great vessels normal in caliber. No aortic dissection. Aortic and branch vessel atherosclerosis. Mediastinum/Nodes: There is a density that projects along the right posterior aspect of the esophagus, at the level of the left atrium, which was not present on the prior CT. This is consistent with the ingested tooth. Endotracheal tube tip lies at the Carina. Nasal/orogastric tube passes below the diaphragm well into the stomach. No neck base or axillary masses or adenopathy. No mediastinal or hilar masses. No pathologically enlarged lymph nodes. Lungs/Pleura: Minimal pleural effusions. There is bilateral lower lobe consolidation and/or atelectasis. Mild interstitial thickening noted in the lower lungs. No convincing pulmonary edema. No radiopaque foreign body in the lungs. No pneumothorax. Musculoskeletal: No fracture or acute finding. No osteoblastic or osteolytic lesions. CT ABDOMEN PELVIS FINDINGS Hepatobiliary: No focal liver abnormality is seen. No gallstones, gallbladder wall thickening, or biliary dilatation. Pancreas: Unremarkable. No pancreatic ductal dilatation or surrounding inflammatory changes. Spleen: Normal in size without focal abnormality. Adrenals/Urinary Tract: No adrenal masses. Bilateral renal cortical thinning. 1 cm low-density lesion  in the anterior midpole the left kidney, likely a cyst, similar to the prior CT angiogram. No other renal masses. No hydronephrosis. Normal ureters. Bladder is  decompressed by Foley catheter. Stomach/Bowel: There is a focal density in the dependent aspect of the gastric fundus, measuring 8 mm. This may reflect an additional ingested tooth cap. Stomach is otherwise unremarkable. No bowel dilation, wall thickening or inflammation. There are sigmoid diverticula. Appendix not visualized. No evidence of appendicitis. Vascular/Lymphatic: Extensive aortic atherosclerosis. No aneurysm. No adenopathy. Reproductive: Status post hysterectomy. No adnexal masses. Other: No abdominal wall hernia or abnormality. No abdominopelvic ascites. Musculoskeletal: No fracture or acute finding. No osteoblastic or osteolytic lesions. IMPRESSION: 1. Radiopaque foreign body projects in the right posterior distal esophagus consistent with the history of an ingested tooth cap. There is another radiopaque foreign body in the posterior stomach, which may reflect an additional ingested tooth cap. 2. Bilateral lower lobe opacity, most likely atelectasis. Consider pneumonia if there are consistent clinical findings. This is greater on the left. 3. Minimal bilateral pleural effusions. 4. No acute abnormalities in the abdomen or pelvis. Electronically Signed   By: Amie Portland M.D.   On: 08/08/2018 17:01    Assessment/Plan Principal Problem:   Acute on chronic respiratory failure with hypoxia (HCC) Active Problems:   AF (paroxysmal atrial fibrillation) (HCC)   Recurrent left pleural effusion   Chronic systolic CHF (congestive heart failure) (HCC)   1. Acute on chronic respiratory failure with hypoxia we will continue to wean on the pressure support as mentioned above the goal was for 8 hours 2. Paroxysmal atrial fibrillation rate is controlled we will continue to monitor 3. Recurrent left-sided pleural effusion follow-up x-rays 4. Chronic systolic heart failure right now compensated continue present therapy   I have personally seen and evaluated the patient, evaluated laboratory and imaging  results, formulated the assessment and plan and placed orders. The Patient requires high complexity decision making for assessment and support.  Case was discussed on Rounds with the Respiratory Therapy Staff  Yevonne Pax, MD [facility] Pulmonary Critical Care Medicine Sleep Medicine

## 2018-08-11 DIAGNOSIS — I5022 Chronic systolic (congestive) heart failure: Secondary | ICD-10-CM | POA: Diagnosis not present

## 2018-08-11 DIAGNOSIS — I480 Paroxysmal atrial fibrillation: Secondary | ICD-10-CM | POA: Diagnosis not present

## 2018-08-11 DIAGNOSIS — J90 Pleural effusion, not elsewhere classified: Secondary | ICD-10-CM | POA: Diagnosis not present

## 2018-08-11 DIAGNOSIS — J9621 Acute and chronic respiratory failure with hypoxia: Secondary | ICD-10-CM | POA: Diagnosis not present

## 2018-08-11 NOTE — Progress Notes (Signed)
Pulmonary Critical Care Medicine [facility] [place]   PULMONARY CRITICAL CARE SERVICE  PROGRESS NOTE  Date of Service: 08/11/2018  Kelly Garner  WGN:[id number]  DOB: 10/30/1933   DOA: 08/04/2018  Referring Physician: Carron Curie, MD  HPI: Kelly Garner is a 84 y.o. female seen for follow up of Acute on Chronic Respiratory Failure.  Patient is comfortable weaning on pressure support trial and continue to advance it remains orally intubated  Medications: Reviewed on Rounds  Physical Exam:  Vitals: Temperature 96.9 pulse 82 respiratory rate 18 blood pressure 153/88 saturations 98%  Ventilator Settings mode of ventilation pressure support FiO2 28% pressure support 12 PEEP 5  . General: Comfortable at this time . Eyes: Grossly normal lids, irises & conjunctiva . ENT: grossly tongue is normal . Neck: no obvious mass . Cardiovascular: S1 S2 normal no gallop . Respiratory: No rhonchi no rales are noted at this time . Abdomen: soft . Skin: no rash seen on limited exam . Musculoskeletal: not rigid . Psychiatric:unable to assess . Neurologic: no seizure no involuntary movements         Lab Data:   Basic Metabolic Panel: Recent Labs  Lab 08/06/18 0700  NA 140  K 3.5  CL 101  CO2 27  GLUCOSE 136*  BUN 35*  CREATININE 0.97  CALCIUM 9.9    ABG: Recent Labs  Lab 08/06/18 0000  PHART 7.501*  PCO2ART 38.2  PO2ART 69.2*  HCO3 29.6*  O2SAT 93.6    Liver Function Tests: Recent Labs  Lab 08/06/18 0700  AST 21  ALT 20  ALKPHOS 91  BILITOT 0.3  PROT 7.4  ALBUMIN 2.5*   No results for input(s): LIPASE, AMYLASE in the last 168 hours. No results for input(s): AMMONIA in the last 168 hours.  CBC: Recent Labs  Lab 08/07/18 0743  WBC 13.4*  HGB 9.7*  HCT 30.4*  MCV 94.4  PLT 254    Cardiac Enzymes: No results for input(s): CKTOTAL, CKMB, CKMBINDEX, TROPONINI in the last 168 hours.  BNP (last 3 results) No results for input(s): BNP in the last  8760 hours.  ProBNP (last 3 results) No results for input(s): PROBNP in the last 8760 hours.  Radiological Exams: No results found.  Assessment/Plan Principal Problem:   Acute on chronic respiratory failure with hypoxia (HCC) Active Problems:   AF (paroxysmal atrial fibrillation) (HCC)   Recurrent left pleural effusion   Chronic systolic CHF (congestive heart failure) (HCC)   1. Acute on chronic respiratory failure with hypoxia we will continue with pressure support titrate oxygen continue pulmonary toilet. 2. Chronic atrial fibrillation rate is controlled we will continue with supportive care 3. Recurrent left pleural effusion at baseline we will follow 4. Chronic systolic heart failure stable, will continue present therapy   I have personally seen and evaluated the patient, evaluated laboratory and imaging results, formulated the assessment and plan and placed orders. The Patient requires high complexity decision making for assessment and support.  Case was discussed on Rounds with the Respiratory Therapy Staff  Yevonne Pax, MD [facility] Pulmonary Critical Care Medicine Sleep Medicine

## 2018-08-12 DIAGNOSIS — I480 Paroxysmal atrial fibrillation: Secondary | ICD-10-CM | POA: Diagnosis not present

## 2018-08-12 DIAGNOSIS — J90 Pleural effusion, not elsewhere classified: Secondary | ICD-10-CM | POA: Diagnosis not present

## 2018-08-12 DIAGNOSIS — J9621 Acute and chronic respiratory failure with hypoxia: Secondary | ICD-10-CM | POA: Diagnosis not present

## 2018-08-12 DIAGNOSIS — I5022 Chronic systolic (congestive) heart failure: Secondary | ICD-10-CM | POA: Diagnosis not present

## 2018-08-12 LAB — CULTURE, RESPIRATORY W GRAM STAIN

## 2018-08-12 LAB — CULTURE, RESPIRATORY

## 2018-08-12 NOTE — Progress Notes (Signed)
Pulmonary Critical Care Medicine [facility] [place]   PULMONARY CRITICAL CARE SERVICE  PROGRESS NOTE  Date of Service: 08/12/2018  Kelly Garner  [id number]  DOB: December 18, 1932   DOA: 08/28/2018  Referring Physician: Carron Curie, MD  HPI: Kelly Garner is a 84 y.o. female seen for follow up of Acute on Chronic Respiratory Failure.  Patient is on pressure support weaning the goal was for 16 hours so far looks good is tolerating the wean  Medications: Reviewed on Rounds  Physical Exam:  Vitals: Temperature 98.6 pulse 79 respiratory rate 18 blood pressure 149/77 saturations are 99%  Ventilator Settings mode of ventilation pressure support FiO2 35% tidal volume 820 pressure support 12 PEEP 5  . General: Comfortable at this time . Eyes: Grossly normal lids, irises & conjunctiva . ENT: grossly tongue is normal . Neck: no obvious mass . Cardiovascular: S1 S2 normal no gallop . Respiratory: No rhonchi or rales are noted at this time . Abdomen: soft . Skin: no rash seen on limited exam . Musculoskeletal: not rigid . Psychiatric:unable to assess . Neurologic: no seizure no involuntary movements         Lab Data:   Basic Metabolic Panel: Recent Labs  Lab 08/06/18 0700  NA 140  K 3.5  CL 101  CO2 27  GLUCOSE 136*  BUN 35*  CREATININE 0.97  CALCIUM 9.9    ABG: Recent Labs  Lab 08/06/18 0000  PHART 7.501*  PCO2ART 38.2  PO2ART 69.2*  HCO3 29.6*  O2SAT 93.6    Liver Function Tests: Recent Labs  Lab 08/06/18 0700  AST 21  ALT 20  ALKPHOS 91  BILITOT 0.3  PROT 7.4  ALBUMIN 2.5*   No results for input(s): LIPASE, AMYLASE in the last 168 hours. No results for input(s): AMMONIA in the last 168 hours.  CBC: Recent Labs  Lab 08/07/18 0743  WBC 13.4*  HGB 9.7*  HCT 30.4*  MCV 94.4  PLT 254    Cardiac Enzymes: No results for input(s): CKTOTAL, CKMB, CKMBINDEX, TROPONINI in the last 168 hours.  BNP (last 3 results) No results for input(s):  BNP in the last 8760 hours.  ProBNP (last 3 results) No results for input(s): PROBNP in the last 8760 hours.  Radiological Exams: No results found.  Assessment/Plan Principal Problem:   Acute on chronic respiratory failure with hypoxia (HCC) Active Problems:   AF (paroxysmal atrial fibrillation) (HCC)   Recurrent left pleural effusion   Chronic systolic CHF (congestive heart failure) (HCC)   1. Acute on chronic respiratory failure with hypoxia we will continue to wean as mentioned above the goal is for 16 hours on pressure support.  I would like to give her a trial of extubation if possible she seems to be clinically improving we will continue to monitor 2. Atrial fibrillation paroxysmal right now the rate is controlled we will continue to follow 3. Recurrent pleural effusions at baseline we will continue to monitor 4. Chronic systolic heart failure at baseline we will continue to follow along   I have personally seen and evaluated the patient, evaluated laboratory and imaging results, formulated the assessment and plan and placed orders. The Patient requires high complexity decision making for assessment and support.  Case was discussed on Rounds with the Respiratory Therapy Staff  Yevonne Pax, MD [facility] Pulmonary Critical Care Medicine Sleep Medicine

## 2018-08-14 DIAGNOSIS — J181 Lobar pneumonia, unspecified organism: Secondary | ICD-10-CM

## 2018-08-14 DIAGNOSIS — J90 Pleural effusion, not elsewhere classified: Secondary | ICD-10-CM | POA: Diagnosis not present

## 2018-08-14 DIAGNOSIS — I5022 Chronic systolic (congestive) heart failure: Secondary | ICD-10-CM | POA: Diagnosis not present

## 2018-08-14 DIAGNOSIS — I480 Paroxysmal atrial fibrillation: Secondary | ICD-10-CM | POA: Diagnosis not present

## 2018-08-14 DIAGNOSIS — J9621 Acute and chronic respiratory failure with hypoxia: Secondary | ICD-10-CM | POA: Diagnosis not present

## 2018-08-14 LAB — CBC
HCT: 34.5 % — ABNORMAL LOW (ref 36.0–46.0)
HEMOGLOBIN: 10.6 g/dL — AB (ref 12.0–15.0)
MCH: 29.3 pg (ref 26.0–34.0)
MCHC: 30.7 g/dL (ref 30.0–36.0)
MCV: 95.3 fL (ref 80.0–100.0)
NRBC: 0 % (ref 0.0–0.2)
PLATELETS: 220 10*3/uL (ref 150–400)
RBC: 3.62 MIL/uL — AB (ref 3.87–5.11)
RDW: 14.3 % (ref 11.5–15.5)
WBC: 16.6 10*3/uL — ABNORMAL HIGH (ref 4.0–10.5)

## 2018-08-14 LAB — BASIC METABOLIC PANEL
ANION GAP: 12 (ref 5–15)
BUN: 62 mg/dL — ABNORMAL HIGH (ref 8–23)
CHLORIDE: 101 mmol/L (ref 98–111)
CO2: 30 mmol/L (ref 22–32)
Calcium: 9 mg/dL (ref 8.9–10.3)
Creatinine, Ser: 0.97 mg/dL (ref 0.44–1.00)
GFR calc Af Amer: >60 mL/min (ref >60)
GFR, EST NON AFRICAN AMERICAN: 53 mL/min — AB (ref >60)
Glucose, Bld: 269 mg/dL — ABNORMAL HIGH (ref 70–99)
POTASSIUM: 3.8 mmol/L (ref 3.5–5.1)
SODIUM: 143 mmol/L (ref 135–145)

## 2018-08-14 NOTE — Progress Notes (Signed)
Pulmonary Critical Care Medicine [facility] [place]   PULMONARY CRITICAL CARE SERVICE  PROGRESS NOTE  Date of Service: 08/14/2018  Kelly Garner  [id number]  DOB: May 10, 1933   DOA:   Referring Physician: Carron Curie, MD  HPI: Kelly Garner is a 84 y.o. female seen for follow up of Acute on Chronic Respiratory Failure.  Patient is weaning on pressure support right now has been on 30% FiO2 she remains orally intubated.  Has good volumes noted in the 700 range slow respiratory rate of around 12-18.  She has not had any increased work of breathing noted and she is currently on a pressure support of 12 PEEP 5  Medications: Reviewed on Rounds  Physical Exam:  Vitals: Temperature 97.8 pulse 95 respiratory rate 18 blood pressure 137/86 saturations 96%  Ventilator Settings mode of ventilation pressure support FiO2 30% tidal volume 645 PEEP 5 pressure support 12  . General: Comfortable at this time . Eyes: Grossly normal lids, irises & conjunctiva . ENT: grossly tongue is normal . Neck: no obvious mass . Cardiovascular: S1 S2 normal no gallop . Respiratory: No rhonchi or rales are noted at this time . Abdomen: soft . Skin: no rash seen on limited exam . Musculoskeletal: not rigid . Psychiatric:unable to assess . Neurologic: no seizure no involuntary movements         Lab Data:   Basic Metabolic Panel: Recent Labs  Lab 08/14/18 0704  NA 143  K 3.8  CL 101  CO2 30  GLUCOSE 269*  BUN 62*  CREATININE 0.97  CALCIUM 9.0    ABG: No results for input(s): PHART, PCO2ART, PO2ART, HCO3, O2SAT in the last 168 hours.  Liver Function Tests: No results for input(s): AST, ALT, ALKPHOS, BILITOT, PROT, ALBUMIN in the last 168 hours. No results for input(s): LIPASE, AMYLASE in the last 168 hours. No results for input(s): AMMONIA in the last 168 hours.  CBC: Recent Labs  Lab 08/14/18 0704  WBC 16.6*  HGB 10.6*  HCT 34.5*  MCV 95.3  PLT 220    Cardiac  Enzymes: No results for input(s): CKTOTAL, CKMB, CKMBINDEX, TROPONINI in the last 168 hours.  BNP (last 3 results) No results for input(s): BNP in the last 8760 hours.  ProBNP (last 3 results) No results for input(s): PROBNP in the last 8760 hours.  Radiological Exams: No results found.  Assessment/Plan Principal Problem:   Acute on chronic respiratory failure with hypoxia (HCC) Active Problems:   AF (paroxysmal atrial fibrillation) (HCC)   Recurrent left pleural effusion   Chronic systolic CHF (congestive heart failure) (HCC)   Lobar pneumonia (HCC)   1. Acute on chronic respiratory failure with hypoxia patient is going to continue with wean on pressure support she should be able to meet 24-hour goal.  I think based on how well she is doing she should be given a trial of extubation. 2. Accessible atrial fibrillation rate has been perfectly well controlled we will continue with present therapy 3. Recurrent pleural effusion she is at baseline we will continue with supportive care 4. Chronic systolic heart failure right now appears to be compensated 5. Lobar pneumonia treated we will continue to monitor   I have personally seen and evaluated the patient, evaluated laboratory and imaging results, formulated the assessment and plan and placed orders. The Patient requires high complexity decision making for assessment and support.  Case was discussed on Rounds with the Respiratory Therapy Staff  Yevonne Pax, MD [facility] Pulmonary Critical Care Medicine  Sleep Medicine

## 2018-08-15 ENCOUNTER — Other Ambulatory Visit (HOSPITAL_COMMUNITY): Payer: Medicare Other

## 2018-08-15 DIAGNOSIS — I480 Paroxysmal atrial fibrillation: Secondary | ICD-10-CM | POA: Diagnosis not present

## 2018-08-15 DIAGNOSIS — J90 Pleural effusion, not elsewhere classified: Secondary | ICD-10-CM | POA: Diagnosis not present

## 2018-08-15 DIAGNOSIS — I5022 Chronic systolic (congestive) heart failure: Secondary | ICD-10-CM | POA: Diagnosis not present

## 2018-08-15 DIAGNOSIS — J9621 Acute and chronic respiratory failure with hypoxia: Secondary | ICD-10-CM | POA: Diagnosis not present

## 2018-08-15 NOTE — Progress Notes (Signed)
Pulmonary Critical Care Medicine [facility] [place]   PULMONARY CRITICAL CARE SERVICE  PROGRESS NOTE  Date of Service: 08/15/2018  Maeryn Mcgath  [id number]  DOB: 10-11-1933   DOA: Aug 19, 2018  Referring Physician: Carron Curie, MD  HPI: Elora Wolter is a 84 y.o. female seen for follow up of Acute on Chronic Respiratory Failure.  She is doing very well has been on pressure support now for more than 72 hours.  Plan on giving her an extubation trial today  Medications: Reviewed on Rounds  Physical Exam:  Vitals: Temperature 96.4 pulse 102 respiratory rate 17 blood pressure 120/65 saturation 100%  Ventilator Settings mode ventilation pressure support FiO2 30% tidal line 550 pressure support 12 PEEP 5  . General: Comfortable at this time . Eyes: Grossly normal lids, irises & conjunctiva . ENT: grossly tongue is normal . Neck: no obvious mass . Cardiovascular: S1 S2 normal no gallop . Respiratory: No rhonchi no rales . Abdomen: soft . Skin: no rash seen on limited exam . Musculoskeletal: not rigid . Psychiatric:unable to assess . Neurologic: no seizure no involuntary movements         Lab Data:   Basic Metabolic Panel: Recent Labs  Lab 08/14/18 0704  NA 143  K 3.8  CL 101  CO2 30  GLUCOSE 269*  BUN 62*  CREATININE 0.97  CALCIUM 9.0    ABG: No results for input(s): PHART, PCO2ART, PO2ART, HCO3, O2SAT in the last 168 hours.  Liver Function Tests: No results for input(s): AST, ALT, ALKPHOS, BILITOT, PROT, ALBUMIN in the last 168 hours. No results for input(s): LIPASE, AMYLASE in the last 168 hours. No results for input(s): AMMONIA in the last 168 hours.  CBC: Recent Labs  Lab 08/14/18 0704  WBC 16.6*  HGB 10.6*  HCT 34.5*  MCV 95.3  PLT 220    Cardiac Enzymes: No results for input(s): CKTOTAL, CKMB, CKMBINDEX, TROPONINI in the last 168 hours.  BNP (last 3 results) No results for input(s): BNP in the last 8760 hours.  ProBNP (last 3  results) No results for input(s): PROBNP in the last 8760 hours.  Radiological Exams: No results found.  Assessment/Plan Principal Problem:   Acute on chronic respiratory failure with hypoxia (HCC) Active Problems:   AF (paroxysmal atrial fibrillation) (HCC)   Recurrent left pleural effusion   Chronic systolic CHF (congestive heart failure) (HCC)   Lobar pneumonia (HCC)   1. Acute on chronic respiratory failure with hypoxia we will continue to wean we will proceed to extubation. 2. Atrial fibrillation rate is controlled 3. Recurrent left pleural effusion follow x-rays 4. Chronic systolic heart failure at baseline 5. Lobar pneumonia treated clinically resolved   I have personally seen and evaluated the patient, evaluated laboratory and imaging results, formulated the assessment and plan and placed orders. The Patient requires high complexity decision making for assessment and support.  Case was discussed on Rounds with the Respiratory Therapy Staff  Yevonne Pax, MD [facility] Pulmonary Critical Care Medicine Sleep Medicine

## 2018-08-16 DIAGNOSIS — J90 Pleural effusion, not elsewhere classified: Secondary | ICD-10-CM | POA: Diagnosis not present

## 2018-08-16 DIAGNOSIS — J9621 Acute and chronic respiratory failure with hypoxia: Secondary | ICD-10-CM | POA: Diagnosis not present

## 2018-08-16 DIAGNOSIS — I480 Paroxysmal atrial fibrillation: Secondary | ICD-10-CM | POA: Diagnosis not present

## 2018-08-16 DIAGNOSIS — I5022 Chronic systolic (congestive) heart failure: Secondary | ICD-10-CM | POA: Diagnosis not present

## 2018-08-16 NOTE — Progress Notes (Signed)
Pulmonary Critical Care Medicine [facility] [place]   PULMONARY CRITICAL CARE SERVICE  PROGRESS NOTE  Date of Service: 08/16/2018  Kelly Garner  [id number]  DOB: 10-28-1933   DOA: 08/08/2018  Referring Physician: Carron Curie, MD  HPI: Kelly Garner is a 84 y.o. female seen for follow up of Acute on Chronic Respiratory Failure.  She is doing well extubated no distress at this time.  Medications: Reviewed on Rounds  Physical Exam:  Vitals: Temperature 97.2 pulse 98 respiratory rate 22 blood pressure 108/57 saturations were 99%  Ventilator Settings off the ventilator  . General: Comfortable at this time . Eyes: Grossly normal lids, irises & conjunctiva . ENT: grossly tongue is normal . Neck: no obvious mass . Cardiovascular: S1 S2 normal no gallop . Respiratory: No rhonchi no rales . Abdomen: soft . Skin: no rash seen on limited exam . Musculoskeletal: not rigid . Psychiatric:unable to assess . Neurologic: no seizure no involuntary movements         Lab Data:   Basic Metabolic Panel: Recent Labs  Lab 08/14/18 0704  NA 143  K 3.8  CL 101  CO2 30  GLUCOSE 269*  BUN 62*  CREATININE 0.97  CALCIUM 9.0    ABG: No results for input(s): PHART, PCO2ART, PO2ART, HCO3, O2SAT in the last 168 hours.  Liver Function Tests: No results for input(s): AST, ALT, ALKPHOS, BILITOT, PROT, ALBUMIN in the last 168 hours. No results for input(s): LIPASE, AMYLASE in the last 168 hours. No results for input(s): AMMONIA in the last 168 hours.  CBC: Recent Labs  Lab 08/14/18 0704  WBC 16.6*  HGB 10.6*  HCT 34.5*  MCV 95.3  PLT 220    Cardiac Enzymes: No results for input(s): CKTOTAL, CKMB, CKMBINDEX, TROPONINI in the last 168 hours.  BNP (last 3 results) No results for input(s): BNP in the last 8760 hours.  ProBNP (last 3 results) No results for input(s): PROBNP in the last 8760 hours.  Radiological Exams: Dg Abd Portable 1v  Result Date:  08/15/2018 CLINICAL DATA:  NG tube placement EXAM: PORTABLE ABDOMEN - 1 VIEW COMPARISON:  08/06/2018 FINDINGS: Enteric tube tip projects over the body of the stomach with side port below GE junction. No dilated loops of bowel identified. IMPRESSION: Enteric tube tip projects over the body of stomach. Electronically Signed   By: Signa Kell M.D.   On: 08/15/2018 13:46    Assessment/Plan Principal Problem:   Acute on chronic respiratory failure with hypoxia (HCC) Active Problems:   AF (paroxysmal atrial fibrillation) (HCC)   Recurrent left pleural effusion   Chronic systolic CHF (congestive heart failure) (HCC)   Lobar pneumonia (HCC)   1. Acute on chronic respiratory failure with hypoxia doing better she is now extubated off the ventilator we will continue to monitor 2. Paroxysmal atrial fibrillation rate is controlled monitor 3. Recurrent pleural effusions follow x-rays as necessary 4. Chronic systolic heart failure right now she is compensated 5. Lobar pneumonia treated clinically improved   I have personally seen and evaluated the patient, evaluated laboratory and imaging results, formulated the assessment and plan and placed orders. The Patient requires high complexity decision making for assessment and support.  Case was discussed on Rounds with the Respiratory Therapy Staff  Yevonne Pax, MD [facility] Pulmonary Critical Care Medicine Sleep Medicine

## 2018-08-19 ENCOUNTER — Other Ambulatory Visit (HOSPITAL_COMMUNITY): Payer: Medicare Other

## 2018-08-19 LAB — BLOOD GAS, ARTERIAL
ACID-BASE EXCESS: 8 mmol/L — AB (ref 0.0–2.0)
BICARBONATE: 31.5 mmol/L — AB (ref 20.0–28.0)
FIO2: 21
O2 Saturation: 94.3 %
PH ART: 7.508 — AB (ref 7.350–7.450)
Patient temperature: 98.6
pCO2 arterial: 39.9 mmHg (ref 32.0–48.0)
pO2, Arterial: 72.3 mmHg — ABNORMAL LOW (ref 83.0–108.0)

## 2018-08-19 LAB — CBC
HEMATOCRIT: 44.1 % (ref 36.0–46.0)
HEMOGLOBIN: 13.2 g/dL (ref 12.0–15.0)
MCH: 29 pg (ref 26.0–34.0)
MCHC: 29.9 g/dL — ABNORMAL LOW (ref 30.0–36.0)
MCV: 96.9 fL (ref 80.0–100.0)
NRBC: 0 % (ref 0.0–0.2)
PLATELETS: 184 10*3/uL (ref 150–400)
RBC: 4.55 MIL/uL (ref 3.87–5.11)
RDW: 14.9 % (ref 11.5–15.5)
WBC: 25.2 10*3/uL — ABNORMAL HIGH (ref 4.0–10.5)

## 2018-08-19 LAB — BASIC METABOLIC PANEL
ANION GAP: 15 (ref 5–15)
BUN: 76 mg/dL — ABNORMAL HIGH (ref 8–23)
CO2: 25 mmol/L (ref 22–32)
Calcium: 9.3 mg/dL (ref 8.9–10.3)
Chloride: 105 mmol/L (ref 98–111)
Creatinine, Ser: 1.01 mg/dL — ABNORMAL HIGH (ref 0.44–1.00)
GFR calc Af Amer: 58 mL/min — ABNORMAL LOW (ref >60)
GFR, EST NON AFRICAN AMERICAN: 50 mL/min — AB (ref >60)
GLUCOSE: 366 mg/dL — AB (ref 70–99)
POTASSIUM: 4.8 mmol/L (ref 3.5–5.1)
Sodium: 145 mmol/L (ref 135–145)

## 2018-08-22 LAB — BLOOD GAS, ARTERIAL
ACID-BASE EXCESS: 6.5 mmol/L — AB (ref 0.0–2.0)
Bicarbonate: 30 mmol/L — ABNORMAL HIGH (ref 20.0–28.0)
O2 Content: 1 L/min
O2 Saturation: 92.5 %
PH ART: 7.494 — AB (ref 7.350–7.450)
Patient temperature: 98.6
pCO2 arterial: 39.3 mmHg (ref 32.0–48.0)
pO2, Arterial: 64.8 mmHg — ABNORMAL LOW (ref 83.0–108.0)

## 2018-08-23 ENCOUNTER — Other Ambulatory Visit (HOSPITAL_COMMUNITY): Payer: Medicare Other

## 2018-08-23 LAB — CBC
HCT: 32.9 % — ABNORMAL LOW (ref 36.0–46.0)
Hemoglobin: 10 g/dL — ABNORMAL LOW (ref 12.0–15.0)
MCH: 30.3 pg (ref 26.0–34.0)
MCHC: 30.4 g/dL (ref 30.0–36.0)
MCV: 99.7 fL (ref 80.0–100.0)
NRBC: 0.1 % (ref 0.0–0.2)
PLATELETS: 111 10*3/uL — AB (ref 150–400)
RBC: 3.3 MIL/uL — AB (ref 3.87–5.11)
RDW: 16.1 % — ABNORMAL HIGH (ref 11.5–15.5)
WBC: 24.2 10*3/uL — ABNORMAL HIGH (ref 4.0–10.5)

## 2018-08-23 NOTE — Code Documentation (Signed)
CODE BLUE NOTE  Patient Name: Kelly Garner   MRN: [id number]   Date of Birth/ Sex: June 27, 1933 , female      Admission Date: 08/22/2018  Attending Provider: Carron Curie, MD  Primary Diagnosis: Vent Dep    Indication: Pt was in her usual state of health until this PM, when she was noted to be in asystole. Code blue was subsequently called. At the time of arrival on scene, ACLS protocol was underway.   Technical Description:  - CPR performance duration:  25 minutes  - Was defibrillation or cardioversion used? No   - Was external pacer placed? No  - Was patient intubated pre/post CPR? No    Medications Administered: Y = Yes; Blank = No Amiodarone    Atropine  Y  Calcium    Epinephrine  Y  Lidocaine    Magnesium    Norepinephrine    Phenylephrine    Sodium bicarbonate  Y  Vasopressin    Other     Post CPR evaluation:  - Final Status - Was patient successfully resuscitated ? No   Miscellaneous Information:  - Time of death:  11:43 PM  - Primary team notified?  Yes  - Family Notified? Yes        Unknown Jim, DO   Sep 14, 2018, 11:51 PM

## 2018-08-24 MED FILL — Medication: Qty: 1 | Status: AC

## 2018-09-02 DEATH — deceased

## 2020-04-18 IMAGING — CT CT HEAD W/O CM
4 series · 16 of 47 positions shown, 18 images · non-contrast
Comparison: None.

CLINICAL DATA: Declining mental status today. Assess for
intracranial hemorrhage. History of stroke and atrial fibrillation.

EXAM:
CT HEAD WITHOUT CONTRAST
TECHNIQUE: Contiguous axial images were obtained from the base of the skull
through the vertex without intravenous contrast.

[Series 3: head without · axial · non-contrast · 0.41mm/px · z∈[+1165,+1305]mm · 7 of 38 slices shown, 9 images]
[im 5/38  brain]
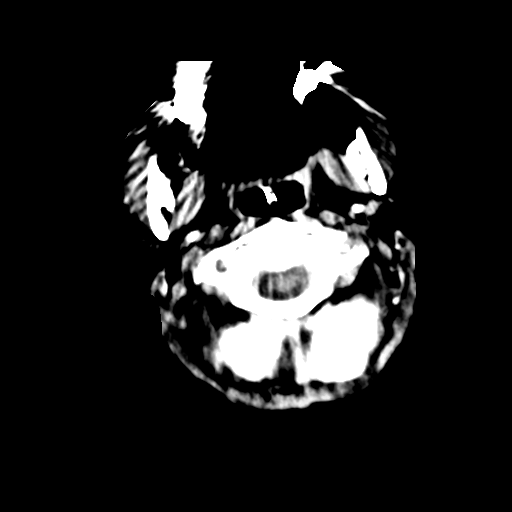
[im 5/38  bone]
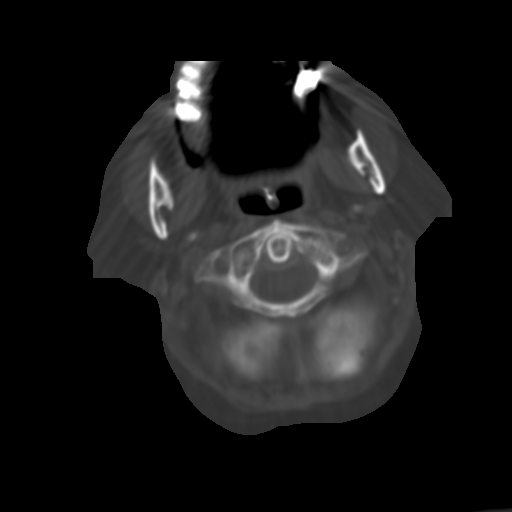
[im 10/38  brain]
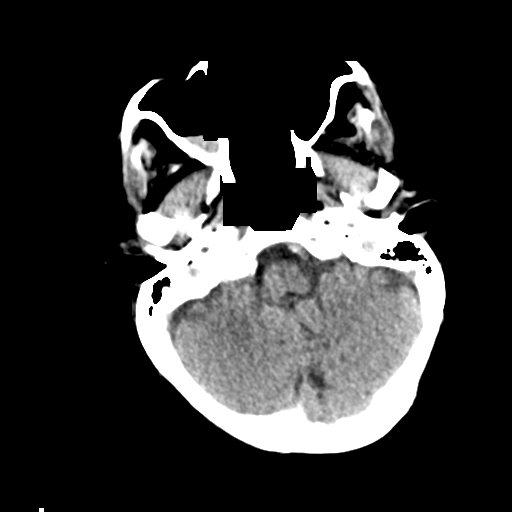
[im 14/38  brain]
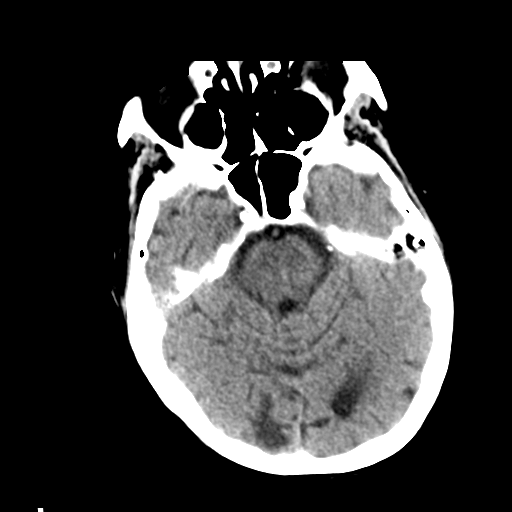
[im 19/38  brain]
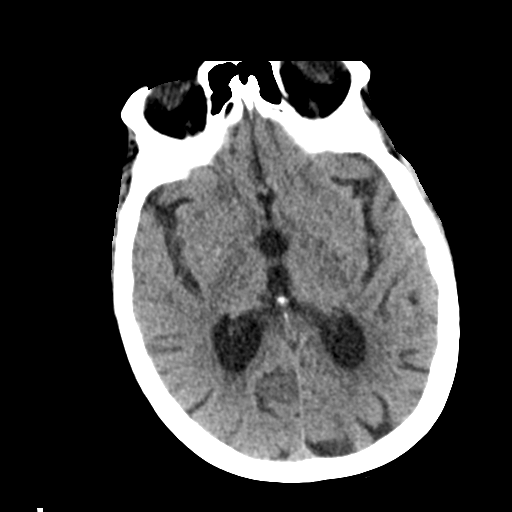
[im 24/38  brain]
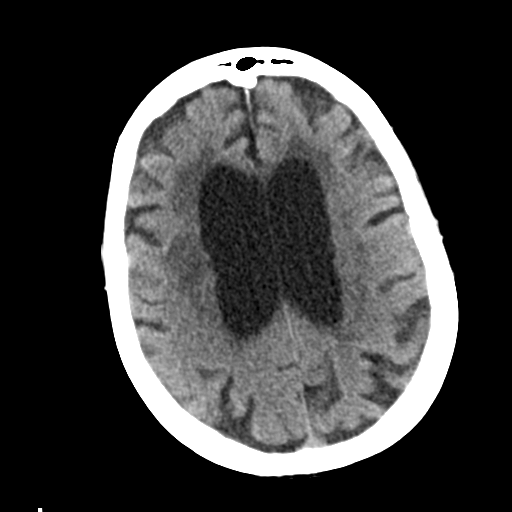
[im 24/38  bone]
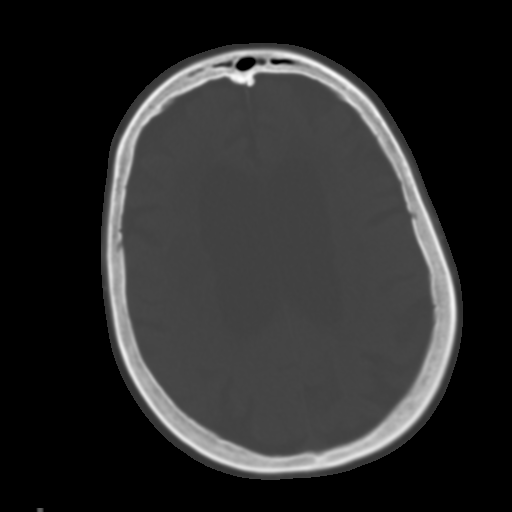
[im 28/38  brain]
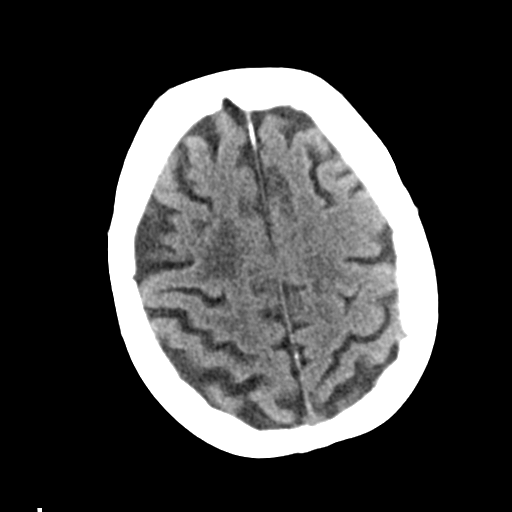
[im 33/38  brain]
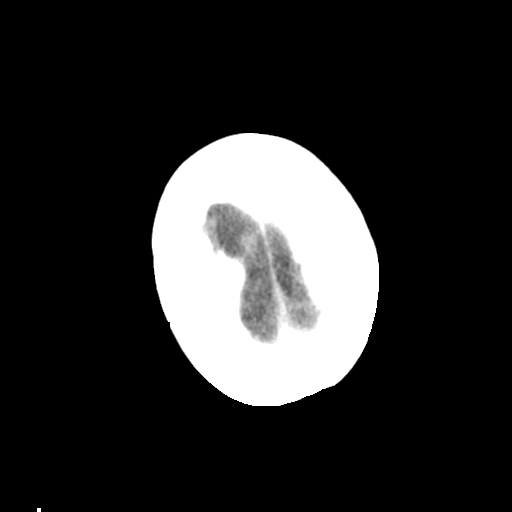

[Series 4: head bone · axial · 0.41mm/px · z∈[+1163,+1201]mm · 3 of 95 slices shown]
[im 10/95  bone]
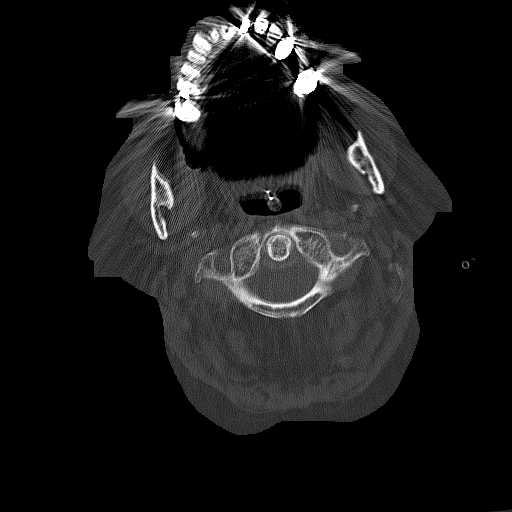
[im 19/95  bone]
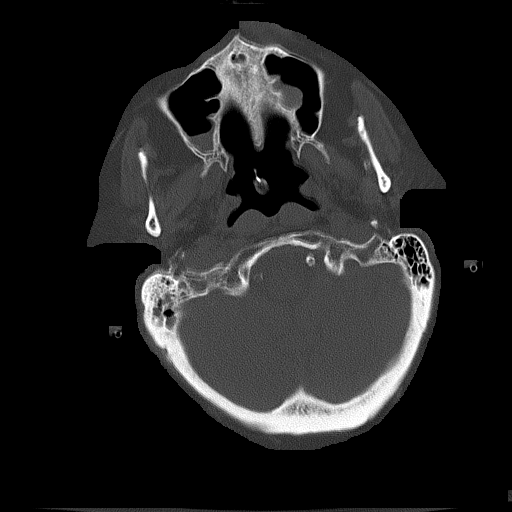
[im 29/95  bone]
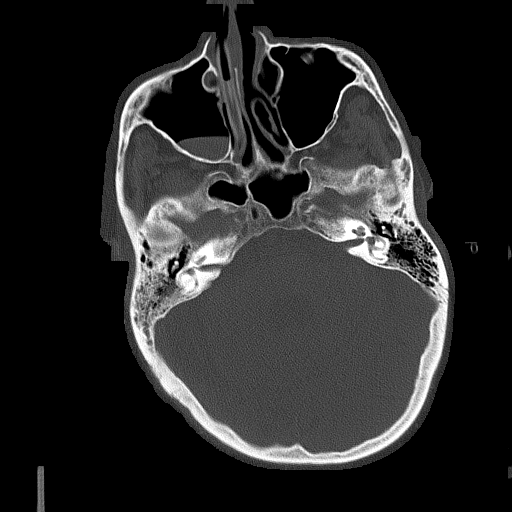

[Series 5: head without cor · coronal · non-contrast · 0.32mm/px · 3 of 67 slices shown]
[im 23/67  brain]
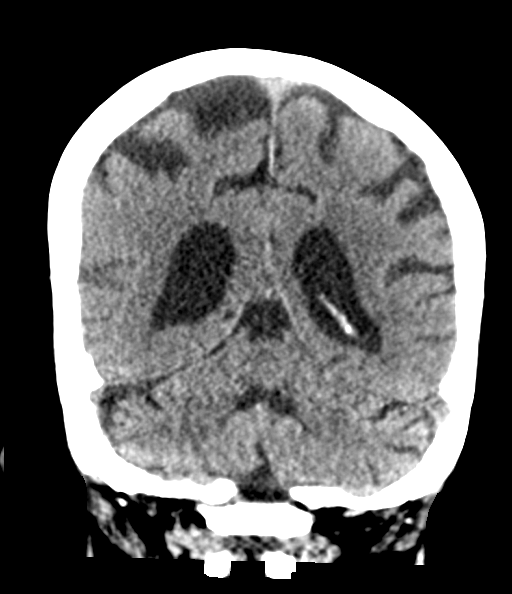
[im 30/67  brain]
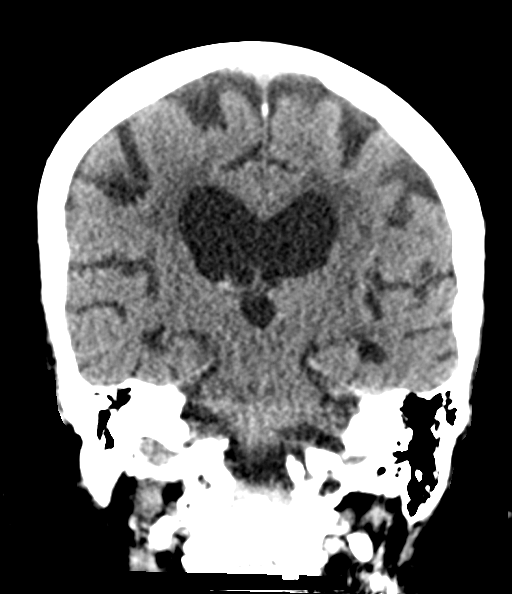
[im 37/67  brain]
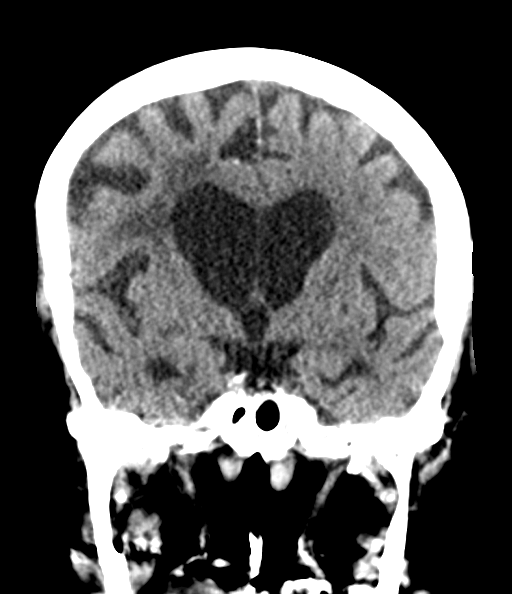

[Series 6: head without sag · sagittal · non-contrast · 0.37mm/px · 3 of 51 slices shown]
[im 17/51  brain]
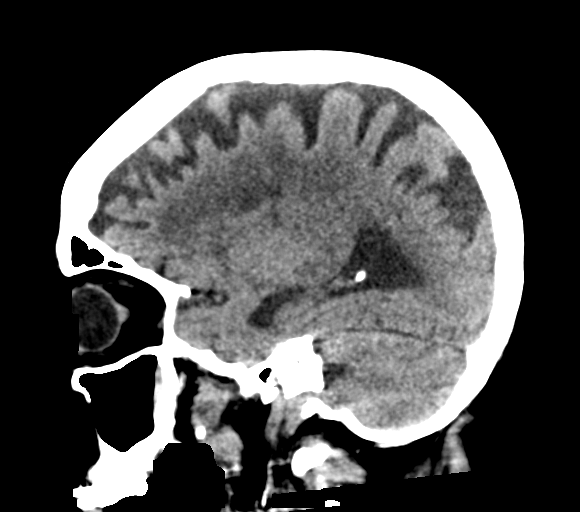
[im 26/51  brain]
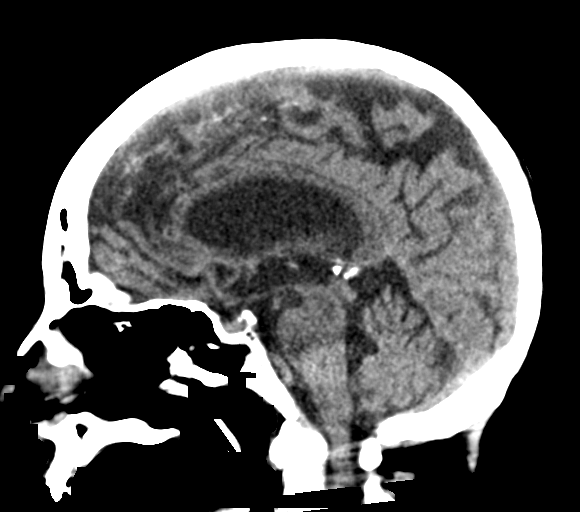
[im 34/51  brain]
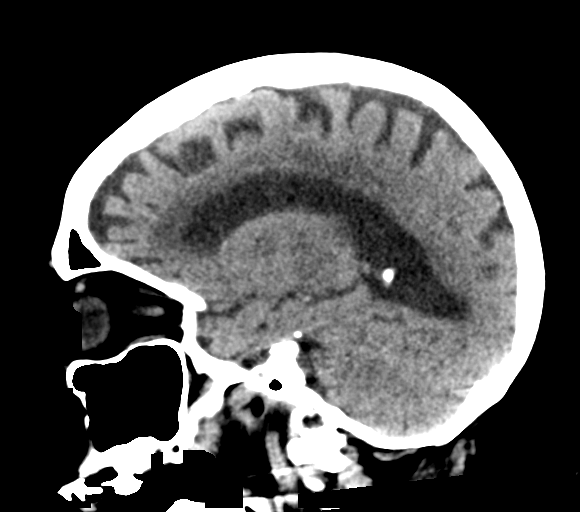

[16 of 47 positions shown; findings below may reference images not displayed]

FINDINGS: BRAIN: No intraparenchymal hemorrhage, mass effect nor midline
shift. Severe parenchymal brain volume loss. No hydrocephalus. RIGHT
posterior occipital lobe encephalomalacia. Old bilateral basal
ganglia and thalami lacunar infarcts. Confluent supratentorial white
matter hypodensities. No acute large vascular territory infarcts. No
abnormal extra-axial fluid collections. Basal cisterns are patent.

VASCULAR: Moderate to severe calcific atherosclerosis of the carotid
siphons.

SKULL: No skull fracture. Moderate LEFT temporomandibular
osteoarthrosis. No significant scalp soft tissue swelling.

SINUSES/ORBITS: Nasogastric tube via RIGHT nares. Small RIGHT
maxillary sinus air-fluid level. Small LEFT maxillary mucosal
retention cyst. Trace mastoid effusions. Included ocular globes and
orbital contents are non-suspicious. Status post RIGHT ocular lens
implant.

OTHER: None.
IMPRESSION: 1. No acute intracranial process.
2. Old RIGHT occipital/PCA territory infarct.
3. Moderate chronic small vessel ischemic disease. Scattered old
lacunar infarcts.
4. Severe parenchymal brain volume loss.
